# Patient Record
Sex: Male | Born: 1995 | Race: Black or African American | Hispanic: No | Marital: Single | Smoking: Current every day smoker
Health system: Southern US, Community
[De-identification: ages and names within clinical notes are randomized; demographics above are authoritative.]

## PROBLEM LIST (undated history)

## (undated) HISTORY — PX: TYMPANOSTOMY TUBE PLACEMENT: SHX32

## (undated) HISTORY — PX: KNEE SURGERY: SHX244

## (undated) HISTORY — PX: CIRCUMCISION: SUR203

---
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
## 2017-01-04 ENCOUNTER — Ambulatory Visit (INDEPENDENT_AMBULATORY_CARE_PROVIDER_SITE_OTHER): Payer: Managed Care, Other (non HMO)

## 2017-01-04 ENCOUNTER — Encounter (HOSPITAL_COMMUNITY): Payer: Self-pay | Admitting: *Deleted

## 2017-01-04 ENCOUNTER — Ambulatory Visit (HOSPITAL_COMMUNITY)
Admission: EM | Admit: 2017-01-04 | Discharge: 2017-01-04 | Disposition: A | Discharge status: Home / Self Care | Payer: Managed Care, Other (non HMO) | Attending: Emergency Medicine | Admitting: Emergency Medicine

## 2017-01-04 DIAGNOSIS — R079 Chest pain, unspecified: Secondary | ICD-10-CM

## 2017-01-04 NOTE — ED Triage Notes (Addendum)
Per pt she's been having chest pain since Tuesday, per pt he is still having chest pains today, per pt its a sharp pain in the center of his chest, pt denies any other sx. Per pt if he breaths heavily chest starts to hurt,

## 2017-01-04 NOTE — ED Notes (Signed)
EKG given to [employer] Burky PA

## 2017-01-04 NOTE — ED Provider Notes (Signed)
[facility]    CSN: [id number] Arrival date & time: 01/04/17  1019     History   Chief Complaint Chief Complaint  Patient presents with  . Chest Pain    HPI Jerry Ibarra is a 21 y.o. male.   Jerry Ibarra presents with complaints of intermittent chest pain which started 12/11. He states it is worse with different movements and with deep breathing. It is to central chest. It is sharp in nature. During childhood has seen cardiology, does not recall what this was for. Has had intermittent chest pains in the past but this has lasted longer. Currently denies any chest pain. Denies palpitations, shortness of breath, nausea, fevers, dizziness, diaphoresis. Otherwise healthy, does not take any medications. Had a cough a few days prior to pain starting but this has resolved. Without skin rash.    ROS per HPI.       History reviewed. No pertinent past medical history.  There are no active problems to display for this patient.   Past Surgical History:  Procedure Laterality Date  . CIRCUMCISION    . KNEE SURGERY    . TYMPANOSTOMY TUBE PLACEMENT         Home Medications    Prior to Admission medications   Medication Sig Start Date End Date Taking? Authorizing Provider  amoxicillin (AMOXIL) 875 MG tablet Take 1 tablet (875 mg total) by mouth 2 (two) times daily. 01/05/16   Elvina SidleLauenstein, Kurt, MD  fluticasone (FLONASE) 50 MCG/ACT nasal spray Place 2 sprays into both nostrils daily. 11/05/16   Servando Salinaossi, Catherine H, NP  guaiFENesin (MUCINEX) 600 MG 12 hr tablet Take by mouth 2 (two) times daily.    [provider]  naproxen (NAPROSYN) 375 MG tablet Take 1 tablet (375 mg total) by mouth 2 (two) times daily. 10/31/14   Deatra Canterxford, William J, FNP    Family History Family History  Problem Relation Age of Onset  . Diabetes Mother     Social History Social History   Tobacco Use  . Smoking status: Former Smoker    Types: Cigarettes  . Smokeless tobacco: Never Used    . Tobacco comment: per pt stopped Aug 2018  Substance Use Topics  . Alcohol use: No  . Drug use: No     Allergies   Patient has no known allergies.   Review of Systems Review of Systems   Physical Exam Triage Vital Signs ED Triage Vitals [01/04/17 1127]  Enc Vitals Group     BP (!) 117/59     Pulse Rate (!) 53     Resp      Temp 98.2 F (36.8 C)     Temp src      SpO2 100 %     Weight      Height      Head Circumference      Peak Flow      Pain Score      Pain Loc      Pain Edu?      Excl. in GC?    No data found.  Updated Vital Signs BP (!) 117/59 (BP Location: Left Arm)   Pulse (!) 53   Temp 98.2 F (36.8 C)   SpO2 100%   Visual Acuity Right Eye Distance:   Left Eye Distance:   Bilateral Distance:    Right Eye Near:   Left Eye Near:    Bilateral Near:     Physical Exam  Constitutional: He is oriented to  person, place, and time. He appears well-developed and well-nourished.  Non-toxic appearance. No distress.  Cardiovascular: Normal rate, regular rhythm, normal heart sounds and normal pulses.  Unable to reproduce chest pain during exam with palpation, movement or coughing. Currently denies chest pain.   Pulmonary/Chest: Effort normal and breath sounds normal.  Neurological: He is alert and oriented to person, place, and time.  Skin: Skin is warm and dry.  Vitals reviewed.  Noted diffuse St elevation to ekg which appears new from EKG from 2014.   UC Treatments / Results  Labs (all labs ordered are listed, but only abnormal results are displayed) Labs Reviewed - No data to display  EKG  EKG Interpretation None       Radiology No results found.  Procedures Procedures (including critical care time)  Medications Ordered in UC Medications - No data to display   Initial Impression / Assessment and Plan / UC Course  I have reviewed the triage vital signs and the nursing notes.  Pertinent labs & imaging results that were available  during my care of the patient were reviewed by me and considered in my medical decision making (see chart for details).     EKG and chest x-ray obtained. Patient currently without pain and unable to reproduce pain. With cardiac history and ekg findings recommended further evaluation in the ER. Dr. SwazilandJordan with cardiology reviewed EKG and agrees that consistent with early repolarization rather than STEMI. Patient stable for self transport to ER at this time for further chest pain evaluation. Patient verbalized understanding and agreeable to plan.    Final Clinical Impressions(s) / UC Diagnoses   Final diagnoses:  Chest pain, unspecified type    ED Discharge Orders    None       Controlled Substance Prescriptions [place] Controlled Substance Registry consulted? Not Applicable   Georgetta HaberBurky, Natalie B, NP 01/04/17 1225

## 2017-05-14 ENCOUNTER — Other Ambulatory Visit: Payer: Self-pay

## 2017-05-14 ENCOUNTER — Ambulatory Visit
Admission: EM | Admit: 2017-05-14 | Discharge: 2017-05-14 | Disposition: A | Discharge status: Home / Self Care | Payer: Managed Care, Other (non HMO) | Attending: Emergency Medicine | Admitting: Emergency Medicine

## 2017-05-14 ENCOUNTER — Encounter: Payer: Self-pay | Admitting: Emergency Medicine

## 2017-05-14 DIAGNOSIS — R197 Diarrhea, unspecified: Secondary | ICD-10-CM

## 2017-05-14 DIAGNOSIS — R519 Headache, unspecified: Secondary | ICD-10-CM

## 2017-05-14 DIAGNOSIS — R51 Headache: Secondary | ICD-10-CM | POA: Diagnosis not present

## 2017-05-14 DIAGNOSIS — R1110 Vomiting, unspecified: Secondary | ICD-10-CM

## 2017-05-14 MED ORDER — ACETAMINOPHEN 500 MG PO TABS
500.0000 mg | ORAL_TABLET | Freq: Once | ORAL | Status: AC
  Administered 2017-05-14: 500 mg via ORAL

## 2017-05-14 MED ORDER — KETOROLAC TROMETHAMINE 60 MG/2ML IM SOLN
30.0000 mg | Freq: Once | INTRAMUSCULAR | Status: AC
  Administered 2017-05-14: 30 mg via INTRAMUSCULAR

## 2017-05-14 MED ORDER — ONDANSETRON 4 MG PO TBDP: 4.0000 mg | ORAL_TABLET | Freq: Three times a day (TID) | ORAL | 0 refills | Status: DC | PRN

## 2017-05-14 MED ORDER — IBUPROFEN 600 MG PO TABS: 600.0000 mg | ORAL_TABLET | Freq: Four times a day (QID) | ORAL | 0 refills | Status: DC | PRN

## 2017-05-14 MED ORDER — ONDANSETRON 8 MG PO TBDP
8.0000 mg | ORAL_TABLET | Freq: Once | ORAL | Status: AC
Start: 2017-05-14 — End: 2017-05-14
  Administered 2017-05-14: 8 mg via ORAL

## 2017-05-14 NOTE — ED Notes (Signed)
Pt given water to drink. 

## 2017-05-14 NOTE — ED Provider Notes (Signed)
HPI  SUBJECTIVE:  Jerry Ibarra is a 22 y.o. male who reports a left-sided frontal constant throbbing headache starting at 3 this morning.  He also reports 4-5 episodes of watery, nonbloody diarrhea as well.  He denies abdominal pain.  He denies fevers, purulent nasal congestion, sinus pain or pressure, upper dental pain.  No neck stiffness.  Mild photophobia.  No phonophobia.  Reports one episode of nausea with emesis while at work today.  No dysarthria, aphasia, discoordination, facial droop, arm or leg weakness, visual changes.  States that he has had headaches like this before.  This is not the first or worst headache of his life.  He tried Advil 200 mg without improvement in his symptoms.  No aggravating factors.  States that his sister had similar symptoms with diarrhea yesterday.  He denies raw or undercooked foods, questionable leftovers, travel.  No alcohol use last night.  Past medical history of headaches, no history of migraines, strokes, aneurysms, abdominal surgeries, diabetes, hypertension, kidney disease.  PMD: None.  History reviewed. No pertinent past medical history.  Past Surgical History:  Procedure Laterality Date  . CIRCUMCISION    . KNEE SURGERY    . TYMPANOSTOMY TUBE PLACEMENT      Family History  Problem Relation Age of Onset  . Diabetes Mother     Social History   Tobacco Use  . Smoking status: Former Smoker    Types: Cigarettes    Last attempt to quit: 09/2016    Years since quitting: 0.6  . Smokeless tobacco: Never Used  . Tobacco comment: per pt stopped Aug 2018  Substance Use Topics  . Alcohol use: No  . Drug use: No    No current facility-administered medications for this encounter.   Current Outpatient Medications:  .  ibuprofen (ADVIL,MOTRIN) 600 MG tablet, Take 1 tablet (600 mg total) by mouth every 6 (six) hours as needed., Disp: 30 tablet, Rfl: 0 .  ondansetron (ZOFRAN ODT) 4 MG disintegrating tablet, Take 1 tablet (4 mg total) by mouth  every 8 (eight) hours as needed for nausea or vomiting., Disp: 20 tablet, Rfl: 0  No Known Allergies   ROS  As noted in HPI.   Physical Exam  BP 115/72 (BP Location: Left Arm)   Pulse 71   Temp 97.7 F (36.5 C) (Oral)   Resp 16   Ht 5\' 10"  (1.778 m)   Wt 175 lb (79.4 kg)   SpO2 100%   BMI 25.11 kg/m   Constitutional: Well developed, well nourished, no acute distress Eyes: PERRL, EOMI, conjunctiva normal bilaterally.  Positive mild photophobia HENT: Normocephalic, atraumatic,mucus membranes moist, normal dentition.  TM normal b/l. No TMJ tenderness. No nasal congestion, no sinus tenderness. No temporal artery tenderness.  Neck: no cervical LN,  No meningismus Respiratory: normal inspiratory effort, lungs clear bilaterally Cardiovascular: Normal rate, no murmurs, rubs, gallops GI: normal Appearance, soft, mild tenderness in the epigastric region with deep palpation, active bowel sounds, no guarding, rebound.  Negative Murphy, negative McBurney. skin: No rash, skin intact Musculoskeletal: No edema, no tenderness, no deformities Neurologic: Alert & oriented x 3, CN II-XII intact, romberg neg, finger-> nose, heel-> shin equal b/l, Romberg neg, tandem gait steady Psychiatric: Speech and behavior appropriate   ED Course   Medications  acetaminophen (TYLENOL) tablet 500 mg (500 mg Oral Given 05/14/17 0846)  ketorolac (TORADOL) injection 30 mg (30 mg Intramuscular Given 05/14/17 0847)  ondansetron (ZOFRAN-ODT) disintegrating tablet 8 mg (8 mg Oral Given 05/14/17 0846)  Orders Placed This Encounter  Procedures  . Offer Fluids    Standing Status:   Standing    Number of Occurrences:   20   No results found for this or any previous visit (from the past 24 hour(s)). No results found.   ED Clinical Impression  Vomiting and diarrhea  Acute nonintractable headache, unspecified headache type  ED Assessment/Plan  Pt describing typical pain, no sudden onset. Doubt SAH, ICH  or space occupying lesion. Pt without fevers/chills, Pt has no meningeal sx, no nuchal rigidity. Doubt meningitis. Pt with normal neuro exam, no evidence of CVA/TIA.  Pt BP not elevated significantly, doubt hypertensive emergency. No evidence of temporal artery tenderness, no evidence of glaucoma or other ocular pathology.  Abdomen is also benign.  He does not appear to be significantly dehydrated.  Suspect viral syndrome since his sister had similar symptoms yesterday.  we will give Toradol, Zofran, APAP 1 gm. offer fluids and reassess.  Work note for today and tomorrow.  Pt much improved after medications. Pt with continued non-focal neuro exam. Will d/c home with nsaid, antiemetic, and have pt F/U with PCP of choice.  Will provide primary care referral list. Discussed MDM, plan for follow up, signs and sx that should prompt return to ER. Pt agrees with plan  Meds ordered this encounter  Medications  . acetaminophen (TYLENOL) tablet 500 mg  . ketorolac (TORADOL) injection 30 mg  . ondansetron (ZOFRAN-ODT) disintegrating tablet 8 mg  . ondansetron (ZOFRAN ODT) 4 MG disintegrating tablet    Sig: Take 1 tablet (4 mg total) by mouth every 8 (eight) hours as needed for nausea or vomiting.    Dispense:  20 tablet    Refill:  0  . ibuprofen (ADVIL,MOTRIN) 600 MG tablet    Sig: Take 1 tablet (600 mg total) by mouth every 6 (six) hours as needed.    Dispense:  30 tablet    Refill:  0    *This clinic note was created using Scientist, clinical (histocompatibility and immunogenetics)Dragon dictation software. Therefore, there may be occasional mistakes despite careful proofreading.  ?   Domenick GongMortenson, Viraj Liby, MD 05/14/17 1057

## 2017-05-14 NOTE — Discharge Instructions (Addendum)
600 mg of ibuprofen with 1 g of Tylenol 3-4 times a day.  Take the Zofran as needed for nausea, and diarrhea.  It will make you constipated.  Push electrolyte containing fluids such as Pedialyte.  Follow-up with a primary care physician as needed for routine care. see list below.  Here is a list of primary care providers who are taking new patients:  Dr. Elizabeth Sauereanna Jones, Dr. Schuyler AmorWilliam Plonk [address] [place] [address] [phone]  [facility] [place] [address]  [place] [address]  [phone]  [facility] [address]  [place], [place] [address] [phone]  [facility] [place] [address]  [phone] [place], [place] [address]  Here are clinics/ other resources who will see you if you do not have insurance. Some have certain criteria that you must meet. Call them and find out what they are:  [facility]: [address] [place] [place], [place] [address] Phone: [phone] Hours: First and Third Saturdays of each Month, 9 a.m. - 1 p.m.  [facility]: [address], [place], [place] [address] Phone: [phone] Hours: Tuesday, 4 p.m. - 8 p.m. Thursday, 1 p.m. - 8 p.m. Wednesday, 9 a.m. - [facility]  [facility] [address], [place], [place] [address] Phone: [phone] Pharmacy Phone Number: [phone] Dental Phone Number: [phone] [facility] Insurance Help: [phone]  Dental Hours: Monday - Thursday, 8 a.m. - 6 p.m.  [facility] [address] [place], [place] [address] Phone: [phone] Pharmacy Phone Number: [phone] [facility] Insurance Help: [phone]  [facility] [address] [place], [place] [address] Phone: [phone] Pharmacy Phone Number: [phone] [facility] Insurance Help: [phone]  [facility] [address] [place], [place] [address] Phone: [phone] [facility] Insurance Help: [phone]    [facility] [address] [place], [place] [address] Phone: [phone]  Go to www.goodrx.com to look up your medications. This will give you a list of where you can find your prescriptions at the most affordable prices. Or ask the pharmacist what the cash price is, or if they have any other discount programs available to help make your medication more affordable. This can be less expensive than what you would pay with insurance.

## 2017-05-14 NOTE — ED Triage Notes (Signed)
Patient in today c/o diarrhea since 3am this morning. Patient states he is mainly here because when he is at work he gets bad headaches. Patient states his head is throbbing now.

## 2017-05-19 ENCOUNTER — Encounter (HOSPITAL_COMMUNITY): Payer: Self-pay | Admitting: Emergency Medicine

## 2017-05-19 ENCOUNTER — Ambulatory Visit (HOSPITAL_COMMUNITY)
Admission: EM | Admit: 2017-05-19 | Discharge: 2017-05-19 | Disposition: A | Discharge status: Home / Self Care | Payer: Managed Care, Other (non HMO) | Attending: Family Medicine | Admitting: Family Medicine

## 2017-05-19 DIAGNOSIS — Z202 Contact with and (suspected) exposure to infections with a predominantly sexual mode of transmission: Secondary | ICD-10-CM | POA: Insufficient documentation

## 2017-05-19 DIAGNOSIS — Z113 Encounter for screening for infections with a predominantly sexual mode of transmission: Secondary | ICD-10-CM | POA: Diagnosis not present

## 2017-05-19 DIAGNOSIS — Z87891 Personal history of nicotine dependence: Secondary | ICD-10-CM | POA: Diagnosis not present

## 2017-05-19 NOTE — Discharge Instructions (Signed)
We have sent testing for sexually transmitted infections. We will notify you of any positive results once they are received. If required, we will prescribe any medications you might need. °

## 2017-05-19 NOTE — ED Triage Notes (Signed)
Pt states "I just want to be checked for STIS" no symptoms.

## 2017-05-19 NOTE — ED Provider Notes (Signed)
-   [facility]   [id number] 05/19/17 Arrival Time: 1208  ASSESSMENT & PLAN:  1. Screening for STDs (sexually transmitted diseases)    Pending: Labs Reviewed  URINE CYTOLOGY ANCILLARY ONLY   Will notify of any positive results. Instructed to refrain from sexual activity until results have been received.  Reviewed expectations re: course of current medical issues. Questions answered. Outlined signs and symptoms indicating need for more acute intervention. Patient verbalized understanding. After Visit Summary given.   SUBJECTIVE:  Jerry Ibarra is a 21 y.o. male who requests STD screening. No symptoms. Afebrile. No abdominal or pelvic pain. No n/v. No rashes or lesions. Sexually active with single male partner.  ROS: As per HPI.  OBJECTIVE:  Vitals:   05/19/17 1247  BP: 129/62  Pulse: (!) 55  Resp: 18  Temp: 98.3 F (36.8 C)  SpO2: 100%     General appearance: alert, cooperative, appears stated age and no distress Throat: lips, mucosa, and tongue normal; teeth and gums normal Back: no CVA tenderness Abdomen: soft, non-tender GU: declines Skin: warm and dry Psychological: Alert and cooperative. Normal mood and affect.   Labs Reviewed  URINE CYTOLOGY ANCILLARY ONLY    No Known Allergies   Family History  Problem Relation Age of Onset  . Diabetes Mother    Social History   Socioeconomic History  . Marital status: Single    Spouse name: Not on file  . Number of children: Not on file  . Years of education: Not on file  . Highest education level: Not on file  Occupational History  . Not on file  Social Needs  . Financial resource strain: Not on file  . Food insecurity:    Worry: Not on file    Inability: Not on file  . Transportation needs:    Medical: Not on file    Non-medical: Not on file  Tobacco Use  . Smoking status: Former Smoker    Types: Cigarettes    Last attempt to quit: 09/2016    Years since quitting: 0.6  . Smokeless  tobacco: Never Used  . Tobacco comment: per pt stopped Aug 2018  Substance and Sexual Activity  . Alcohol use: No  . Drug use: No  . Sexual activity: Not on file  Lifestyle  . Physical activity:    Days per week: Not on file    Minutes per session: Not on file  . Stress: Not on file  Relationships  . Social connections:    Talks on phone: Not on file    Gets together: Not on file    Attends religious service: Not on file    Active member of club or organization: Not on file    Attends meetings of clubs or organizations: Not on file    Relationship status: Not on file  . Intimate partner violence:    Fear of current or ex partner: Not on file    Emotionally abused: Not on file    Physically abused: Not on file    Forced sexual activity: Not on file  Other Topics Concern  . Not on file  Social History Narrative  . Not on file          Mardella Layman, MD 05/19/17 1315

## 2017-05-21 LAB — URINE CYTOLOGY ANCILLARY ONLY
CHLAMYDIA, DNA PROBE: POSITIVE — AB
Neisseria Gonorrhea: NEGATIVE
TRICH (WINDOWPATH): NEGATIVE

## 2017-05-22 ENCOUNTER — Telehealth (HOSPITAL_COMMUNITY): Payer: Self-pay

## 2017-05-22 MED ORDER — AZITHROMYCIN 250 MG PO TABS: 1000.0000 mg | ORAL_TABLET | Freq: Every day | ORAL | 0 refills | Status: AC

## 2017-05-22 NOTE — Progress Notes (Signed)
Chlamydia is positive.  Rx po zithromax 1g #1 dose no refills was sent to the pharmacy of record.  Attempted to reach patient regarding results, no answer at this time, voicemail left.  Need to educate to please refrain from sexual intercourse for 7 days to give the medicine time to work, sexual partners need to be notified and tested/treated.  Condoms may reduce risk of reinfection.  Recheck or followup with PCP for further evaluation if symptoms are not improving.   [facility] notified

## 2017-05-24 ENCOUNTER — Telehealth (HOSPITAL_COMMUNITY): Payer: Self-pay

## 2017-05-24 MED ORDER — AZITHROMYCIN 250 MG PO TABS: 1000.0000 mg | ORAL_TABLET | Freq: Once | ORAL | 0 refills | Status: AC

## 2017-05-24 MED ORDER — ONDANSETRON HCL 4 MG PO TABS: 4.0000 mg | ORAL_TABLET | ORAL | 0 refills | Status: AC

## 2017-05-24 NOTE — Telephone Encounter (Signed)
Pt called reporting throwing up azithromycin. Per [facility] patient must take zofran and wait 30 minutes then repeat dose.

## 2017-05-24 NOTE — Telephone Encounter (Signed)
Pt returned to office to go over results. Aware of positive Chlamydia test and need for treatment, rx sent to pharmacy of choice.  Educated on safe sex practices. Pt verbalized understanding.

## 2017-06-21 ENCOUNTER — Encounter (HOSPITAL_COMMUNITY): Payer: Self-pay | Admitting: Family Medicine

## 2017-06-21 ENCOUNTER — Ambulatory Visit (HOSPITAL_COMMUNITY)
Admission: EM | Admit: 2017-06-21 | Discharge: 2017-06-21 | Disposition: A | Discharge status: Home / Self Care | Payer: Managed Care, Other (non HMO) | Attending: Family Medicine | Admitting: Family Medicine

## 2017-06-21 DIAGNOSIS — J029 Acute pharyngitis, unspecified: Secondary | ICD-10-CM | POA: Insufficient documentation

## 2017-06-21 DIAGNOSIS — Z87891 Personal history of nicotine dependence: Secondary | ICD-10-CM | POA: Insufficient documentation

## 2017-06-21 LAB — POCT RAPID STREP A: STREPTOCOCCUS, GROUP A SCREEN (DIRECT): NEGATIVE

## 2017-06-21 MED ORDER — CETIRIZINE-PSEUDOEPHEDRINE ER 5-120 MG PO TB12: 1.0000 | ORAL_TABLET | Freq: Every day | ORAL | 0 refills | Status: DC

## 2017-06-21 NOTE — ED Triage Notes (Signed)
Pt here for sore throat and fever

## 2017-06-21 NOTE — ED Provider Notes (Signed)
[facility]   [id number] 06/21/17 Arrival Time: 7846  SUBJECTIVE: History from: patient.  Jerry Ibarra is a 22 y.o. male who presents with abrupt onset of sore throat that began 1 day ago.  Admits to positive sick exposure.  Has tried tylenol with temporary relief.  Symptoms are made worse with swallowing.  Reports previous symptoms in the past.   Complains of chills.  Denies fever, fatigue, sinus pain, rhinorrhea, cough, SOB, wheezing, chest pain, nausea, abdominal pain, changes in bowel or bladder habits.    ROS: As per HPI.  History reviewed. No pertinent past medical history. Past Surgical History:  Procedure Laterality Date  . CIRCUMCISION    . KNEE SURGERY    . TYMPANOSTOMY TUBE PLACEMENT     No Known Allergies No current facility-administered medications on file prior to encounter.    Current Outpatient Medications on File Prior to Encounter  Medication Sig Dispense Refill  . ibuprofen (ADVIL,MOTRIN) 600 MG tablet Take 1 tablet (600 mg total) by mouth every 6 (six) hours as needed. (Patient not taking: Reported on 05/19/2017) 30 tablet 0   Social History   Socioeconomic History  . Marital status: Single    Spouse name: Not on file  . Number of children: Not on file  . Years of education: Not on file  . Highest education level: Not on file  Occupational History  . Not on file  Social Needs  . Financial resource strain: Not on file  . Food insecurity:    Worry: Not on file    Inability: Not on file  . Transportation needs:    Medical: Not on file    Non-medical: Not on file  Tobacco Use  . Smoking status: Former Smoker    Types: Cigarettes    Last attempt to quit: 09/2016    Years since quitting: 0.7  . Smokeless tobacco: Never Used  . Tobacco comment: per pt stopped Aug 2018  Substance and Sexual Activity  . Alcohol use: No  . Drug use: No  . Sexual activity: Not on file  Lifestyle  . Physical activity:    Days per week: Not on file   Minutes per session: Not on file  . Stress: Not on file  Relationships  . Social connections:    Talks on phone: Not on file    Gets together: Not on file    Attends religious service: Not on file    Active member of club or organization: Not on file    Attends meetings of clubs or organizations: Not on file    Relationship status: Not on file  . Intimate partner violence:    Fear of current or ex partner: Not on file    Emotionally abused: Not on file    Physically abused: Not on file    Forced sexual activity: Not on file  Other Topics Concern  . Not on file  Social History Narrative  . Not on file   Family History  Problem Relation Age of Onset  . Diabetes Mother     OBJECTIVE:  Vitals:   06/21/17 1842  BP: 122/73  Pulse: 79  Resp: 18  Temp: 99.7 F (37.6 C)  SpO2: 100%     General appearance: AOx3; in no acute distress; appears stated age HEENT: Ears: EACs clear, TMs pearly gray with visible cone of light, without erythema; Eyes: PERRL, EOMI grossly; Sinuses nontender to palpation; Nose: clear rhinorrhea; Throat: oropharynx mildly erythematous, tonsils 1+ without white tonsillar  exudates, uvula midline Neck: supple without LAD Lungs: CTA bilaterally without adventitious breath sounds Heart: regular rate and rhythm.  Radial pulses 2+ symmetrical bilaterally Skin: warm and dry Psychological: alert and cooperative; normal mood and affect   ASSESSMENT & PLAN:  1. Viral pharyngitis     Meds ordered this encounter  Medications  . cetirizine-pseudoephedrine (ZYRTEC-D) 5-120 MG tablet    Sig: Take 1 tablet by mouth daily.    Dispense:  30 tablet    Refill:  0    Order Specific Question:   Supervising Provider    Answer:   Isa Rankin [[id number]]    Strep test negative, will send out for culture and we will call you with results Declines test for mono at this time Get plenty of rest and push fluids Zyrtec D prescribed.   Perform salt water gargles at  home, use throat lozenges, drink warm or cool liquids as needed for symptomatic relief Follow up with PCP if symptoms persist Return or go to ER if you have any new or worsening symptoms   Reviewed expectations re: course of current medical issues. Questions answered. Outlined signs and symptoms indicating need for more acute intervention. Patient verbalized understanding. After Visit Summary given.        Rennis Harding, PA-C 06/21/17 0454

## 2017-06-21 NOTE — Discharge Instructions (Addendum)
Strep test negative, will send out for culture and we will call you with results Declines test for mono at this time Get plenty of rest and push fluids Zyrtec D prescribed.   Perform salt water gargles at home, use throat lozenges, drink warm or cool liquids as needed for symptomatic relief Follow up with PCP if symptoms persist Return or go to ER if you have any new or worsening symptoms

## 2017-06-24 LAB — CULTURE, GROUP A STREP (THRC)

## 2017-07-04 ENCOUNTER — Ambulatory Visit (HOSPITAL_COMMUNITY)
Admission: EM | Admit: 2017-07-04 | Discharge: 2017-07-04 | Disposition: A | Discharge status: Home / Self Care | Payer: Managed Care, Other (non HMO) | Attending: Family Medicine | Admitting: Family Medicine

## 2017-07-04 ENCOUNTER — Encounter (HOSPITAL_COMMUNITY): Payer: Self-pay | Admitting: Emergency Medicine

## 2017-07-04 ENCOUNTER — Other Ambulatory Visit: Payer: Self-pay

## 2017-07-04 DIAGNOSIS — L853 Xerosis cutis: Secondary | ICD-10-CM

## 2017-07-04 NOTE — ED Triage Notes (Signed)
States dry, itchy, red skin in genital area.  First noticed this problem one month ago.  At the time had used a different soap, has changed soaps again and no better

## 2017-07-04 NOTE — ED Provider Notes (Signed)
[facility]   [id number] 07/04/17 Arrival Time: 1001  ASSESSMENT & PLAN:  1. Dry skin dermatitis    OTC Eucerin. Warm showers only. No condom use. Return if not improving over the next 1-2 weeks.  Reviewed expectations re: course of current medical issues. Questions answered. Outlined signs and symptoms indicating need for more acute intervention. Patient verbalized understanding. After Visit Summary given.   SUBJECTIVE:  Jerry Ibarra is a 22 y.o. male who presents with a skin complaint.   Location: penis Onset: gradual Duration: 1 month Pruritic? Yes Painful? No Progression: stable  Drainage? Yes  Known trigger? No  New soaps/lotions/topicals/detergents? No Environmental exposures or allergies? none Contacts with similar? No Recent travel? No  Other associated symptoms: none Therapies tried thus far: none Denies fever. No penile discharge or lesions. No specific aggravating or alleviating factors reported.  Last used condom 3-4 weeks ago but thinks dry skin started before then.  ROS: As per HPI.  OBJECTIVE: Vitals:   07/04/17 1016  BP: 118/67  Pulse: 60  Resp: 16  Temp: 98.1 F (36.7 C)  TempSrc: Oral  SpO2: 100%    General appearance: alert; no distress Lungs: clear to auscultation bilaterally Heart: regular rate and rhythm Extremities: no edema Skin: warm and dry; over penile shaft, dry skin; no penile lesions Psychological: alert and cooperative; normal mood and affect  No Known Allergies   Social History   Socioeconomic History  . Marital status: Single    Spouse name: Not on file  . Number of children: Not on file  . Years of education: Not on file  . Highest education level: Not on file  Occupational History  . Not on file  Social Needs  . Financial resource strain: Not on file  . Food insecurity:    Worry: Not on file    Inability: Not on file  . Transportation needs:    Medical: Not on file    Non-medical: Not on  file  Tobacco Use  . Smoking status: Former Smoker    Types: Cigarettes    Last attempt to quit: 09/2016    Years since quitting: 0.7  . Smokeless tobacco: Never Used  . Tobacco comment: per pt stopped Aug 2018  Substance and Sexual Activity  . Alcohol use: No  . Drug use: No  . Sexual activity: Not on file  Lifestyle  . Physical activity:    Days per week: Not on file    Minutes per session: Not on file  . Stress: Not on file  Relationships  . Social connections:    Talks on phone: Not on file    Gets together: Not on file    Attends religious service: Not on file    Active member of club or organization: Not on file    Attends meetings of clubs or organizations: Not on file    Relationship status: Not on file  . Intimate partner violence:    Fear of current or ex partner: Not on file    Emotionally abused: Not on file    Physically abused: Not on file    Forced sexual activity: Not on file  Other Topics Concern  . Not on file  Social History Narrative  . Not on file   Family History  Problem Relation Age of Onset  . Diabetes Mother    Past Surgical History:  Procedure Laterality Date  . CIRCUMCISION    . KNEE SURGERY    . TYMPANOSTOMY TUBE PLACEMENT  Mardella Layman, MD 07/04/17 1034

## 2017-07-04 NOTE — Discharge Instructions (Addendum)
Try over the counter Eucerin cream twice daily for the next 1-2 weeks. Warm showers only. No condom use. Return if not improving.

## 2018-02-08 ENCOUNTER — Encounter (HOSPITAL_COMMUNITY): Payer: Self-pay | Admitting: Emergency Medicine

## 2018-02-08 ENCOUNTER — Ambulatory Visit (HOSPITAL_COMMUNITY)
Admission: EM | Admit: 2018-02-08 | Discharge: 2018-02-08 | Disposition: A | Discharge status: Home / Self Care | Payer: Self-pay | Attending: Internal Medicine | Admitting: Internal Medicine

## 2018-02-08 ENCOUNTER — Other Ambulatory Visit: Payer: Self-pay

## 2018-02-08 DIAGNOSIS — J111 Influenza due to unidentified influenza virus with other respiratory manifestations: Secondary | ICD-10-CM

## 2018-02-08 DIAGNOSIS — R69 Illness, unspecified: Secondary | ICD-10-CM | POA: Insufficient documentation

## 2018-02-08 MED ORDER — OSELTAMIVIR PHOSPHATE 75 MG PO CAPS: 75.0000 mg | ORAL_CAPSULE | Freq: Two times a day (BID) | ORAL | 0 refills | Status: DC

## 2018-02-08 NOTE — ED Triage Notes (Signed)
PT reports chills, cough, and congestion for 2 days.

## 2018-02-08 NOTE — Discharge Instructions (Signed)
I suspect you are having early influenza, so I am placing your an Tamiflu.  Drink lots of fluids, sip of chicken broth and do not go to work until you do not have a fever for 24h Continue taking the dayquil

## 2018-02-08 NOTE — ED Provider Notes (Signed)
[facility]    CSN: [id number] Arrival date & time: 02/08/18  1419     History   Chief Complaint Chief Complaint  Patient presents with  . URI    HPI Jerry Ibarra is a 23 y.o. male.   Onset of cough 1/15 and cough and some chills. Last night while at work his nose was dripping ever more. Cough is productive with clear mucous. Yesterday at work he was feeling hot and cold but did not check his temp. Denies ST, ear pain, body aches or HA. Has been taking Dayquil. 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     History reviewed. No pertinent past medical history.  There are no active problems to display for this patient.   Past Surgical History:  Procedure Laterality Date  . CIRCUMCISION    . KNEE SURGERY    . TYMPANOSTOMY TUBE PLACEMENT       Home Medications    Prior to Admission medications   Medication Sig Start Date End Date Taking? Authorizing Provider  cetirizine-pseudoephedrine (ZYRTEC-D) 5-120 MG tablet Take 1 tablet by mouth daily. 06/21/17   Wurst, [place]ittany, PA-C  ibuprofen (ADVIL,MOTRIN) 600 MG tablet Take 1 tablet (600 mg total) by mouth every 6 (six) hours as needed. Patient not taking: Reported on 05/19/2017 05/14/17   Domenick GongMortenson, Ashley, MD    Family History Family History  Problem Relation Age of Onset  . Diabetes Mother     Social History Social History   Tobacco Use  . Smoking status: Former Smoker    Types: Cigarettes    Last attempt to quit: 09/2016    Years since quitting: 1.3  . Smokeless tobacco: Never Used  . Tobacco comment: per pt stopped Aug 2018  Substance Use Topics  . Alcohol use: No  . Drug use: No     Allergies   Patient has no known allergies.   Review of Systems Review of Systems  Constitutional: Positive for chills. Negative for diaphoresis and fever.       Has felt hot and cold  HENT: Positive for congestion and rhinorrhea. Negative for ear discharge, ear pain, sore throat and  trouble swallowing.   Eyes: Negative for discharge.  Respiratory: Positive for cough. Negative for chest tightness, shortness of breath and wheezing.   Cardiovascular: Negative for chest pain.  Gastrointestinal: Negative for diarrhea, nausea and vomiting.  Musculoskeletal: Negative for myalgias.  Skin: Negative for rash.  Neurological: Negative for headaches.  Hematological: Negative for adenopathy.     Physical Exam Triage Vital Signs ED Triage Vitals  Enc Vitals Group     BP 02/08/18 1454 129/74     Pulse Rate 02/08/18 1454 67     Resp 02/08/18 1454 16     Temp 02/08/18 1454 98 F (36.7 C)     Temp Source 02/08/18 1454 Oral     SpO2 02/08/18 1454 100 %     Weight --      Height --      Head Circumference --      Peak Flow --      Pain Score 02/08/18 1453 6     Pain Loc --      Pain Edu? --      Excl. in GC? --    No data found.  Updated Vital Signs BP 129/74   Pulse 67   Temp 98 F (36.7 C) (Oral)   Resp 16   SpO2 100%  Visual Acuity Right Eye Distance:   Left Eye Distance:   Bilateral Distance:    Right Eye Near:   Left Eye Near:    Bilateral Near:     Physical Exam Vitals signs and nursing note reviewed.  Constitutional:      General: He is not in acute distress.    Appearance: He is ill-appearing. He is not toxic-appearing or diaphoretic.  HENT:     Head: Normocephalic.     Right Ear: Tympanic membrane, ear canal and external ear normal.     Left Ear: Tympanic membrane, ear canal and external ear normal.     Nose: Congestion and rhinorrhea present.     Mouth/Throat:     Mouth: Mucous membranes are moist.     Pharynx: Oropharynx is clear.  Eyes:     General: No scleral icterus.       Right eye: No discharge.        Left eye: No discharge.     Extraocular Movements: Extraocular movements intact.     Conjunctiva/sclera: Conjunctivae normal.  Neck:     Musculoskeletal: Neck supple. No neck rigidity or muscular tenderness.  Cardiovascular:      Rate and Rhythm: Normal rate and regular rhythm.     Heart sounds: No murmur.  Pulmonary:     Effort: Pulmonary effort is normal.     Breath sounds: Normal breath sounds.  Musculoskeletal: Normal range of motion.  Lymphadenopathy:     Cervical: No cervical adenopathy.  Skin:    General: Skin is warm and dry.     Findings: No rash.  Neurological:     Mental Status: He is alert and oriented to person, place, and time.     Gait: Gait normal.  Psychiatric:        Mood and Affect: Mood normal.        Behavior: Behavior normal.        Thought Content: Thought content normal.        Judgment: Judgment normal.    UC Treatments / Results  Labs (all labs ordered are listed, but only abnormal results are displayed) Labs Reviewed - No data to display  EKG None  Radiology No results found.  Procedures Procedures  Medications Ordered in UC Medications - No data to display  Initial Impression / Assessment and Plan / UC Course  I have reviewed the triage vital signs and the nursing notes. I explained to him we dont have flu test here, but I suspect early influenza due to exposure. I went ahead and placed him on Tamiflu as noted. May continue with Dayquil. See instructions.    Final Clinical Impressions(s) / UC Diagnoses   Final diagnoses:  None   Discharge Instructions   None    ED Prescriptions    None     Controlled Substance Prescriptions [place] Controlled Substance Registry consulted?    Garey Ham, Cordelia Poche 02/08/18 2158

## 2018-02-22 ENCOUNTER — Other Ambulatory Visit: Payer: Self-pay

## 2018-02-22 ENCOUNTER — Ambulatory Visit (HOSPITAL_COMMUNITY)
Admission: EM | Admit: 2018-02-22 | Discharge: 2018-02-22 | Disposition: A | Discharge status: Home / Self Care | Payer: Self-pay | Attending: Internal Medicine | Admitting: Internal Medicine

## 2018-02-22 ENCOUNTER — Encounter (HOSPITAL_COMMUNITY): Payer: Self-pay | Admitting: Emergency Medicine

## 2018-02-22 DIAGNOSIS — J36 Peritonsillar abscess: Secondary | ICD-10-CM | POA: Insufficient documentation

## 2018-02-22 LAB — POCT RAPID STREP A: Streptococcus, Group A Screen (Direct): NEGATIVE

## 2018-02-22 MED ORDER — LIDOCAINE HCL (PF) 1 % IJ SOLN
INTRAMUSCULAR | Status: AC
  Filled 2018-02-22: qty 2

## 2018-02-22 MED ORDER — CEFTRIAXONE SODIUM 1 G IJ SOLR
INTRAMUSCULAR | Status: AC
  Filled 2018-02-22: qty 10

## 2018-02-22 MED ORDER — DEXAMETHASONE SODIUM PHOSPHATE 10 MG/ML IJ SOLN
10.0000 mg | Freq: Once | INTRAMUSCULAR | Status: AC
  Administered 2018-02-22: 10 mg via INTRAMUSCULAR

## 2018-02-22 MED ORDER — CEFTRIAXONE SODIUM 1 G IJ SOLR
1.0000 g | Freq: Once | INTRAMUSCULAR | Status: AC
  Administered 2018-02-22: 1 g via INTRAMUSCULAR

## 2018-02-22 MED ORDER — CLINDAMYCIN HCL 150 MG PO CAPS: 300.0000 mg | ORAL_CAPSULE | Freq: Three times a day (TID) | ORAL | 0 refills | Status: AC

## 2018-02-22 MED ORDER — DEXAMETHASONE SODIUM PHOSPHATE 10 MG/ML IJ SOLN
INTRAMUSCULAR | Status: AC
  Filled 2018-02-22: qty 1

## 2018-02-22 MED ORDER — PREDNISONE 10 MG (21) PO TBPK: ORAL_TABLET | ORAL | 0 refills | Status: DC

## 2018-02-22 NOTE — ED Notes (Signed)
Patient able to ambulate independently  

## 2018-02-22 NOTE — ED Provider Notes (Signed)
[facility]    CSN: [id number] Arrival date & time: 02/22/18  1757     History   Chief Complaint Chief Complaint  Patient presents with  . Sore Throat    HPI Jerry Ibarra is a 23 y.o. male.   Patient is a 23 year old male that presents with approximately 2 to 3 days of sore throat.  Reports that it started on the right side and now has progressed more to the left with more increased swelling on the left.  He is having pain radiating into his left ear and face.  He is having pain with swallowing and spitting out his secretions.  Slightly muffled voice.  He is unsure of any fever but has had some body aches and chills.  No recent sick contacts.  He was seen here approximately 2 weeks ago and treated for flu.  No trismus.  ROS per HPI      History reviewed. No pertinent past medical history.  There are no active problems to display for this patient.   Past Surgical History:  Procedure Laterality Date  . CIRCUMCISION    . KNEE SURGERY    . TYMPANOSTOMY TUBE PLACEMENT         Home Medications    Prior to Admission medications   Medication Sig Start Date End Date Taking? Authorizing Provider  clindamycin (CLEOCIN) 150 MG capsule Take 2 capsules (300 mg total) by mouth 3 (three) times daily for 10 days. 02/22/18 03/04/18  Dahlia ByesBast, Marcques Wrightsman A, NP  oseltamivir (TAMIFLU) 75 MG capsule Take 1 capsule (75 mg total) by mouth every 12 (twelve) hours. 02/08/18   Rodriguez-Southworth, Nettie ElmSylvia, PA-C  predniSONE (STERAPRED UNI-PAK 21 TAB) 10 MG (21) TBPK tablet 6 tabs for 1 day, then 5 tabs for 1 das, then 4 tabs for 1 day, then 3 tabs for 1 day, 2 tabs for 1 day, then 1 tab for 1 day 02/22/18   Janace ArisBast, Felicia Both A, NP    Family History Family History  Problem Relation Age of Onset  . Diabetes Mother     Social History Social History   Tobacco Use  . Smoking status: Former Smoker    Types: Cigarettes    Last attempt to quit: 09/2016    Years since quitting: 1.4  .  Smokeless tobacco: Never Used  . Tobacco comment: per pt stopped Aug 2018  Substance Use Topics  . Alcohol use: No  . Drug use: No     Allergies   Patient has no known allergies.   Review of Systems Review of Systems   Physical Exam Triage Vital Signs ED Triage Vitals  Enc Vitals Group     BP 02/22/18 1835 137/75     Pulse Rate 02/22/18 1835 97     Resp 02/22/18 1835 16     Temp 02/22/18 1835 98.9 F (37.2 C)     Temp Source 02/22/18 1835 Temporal     SpO2 02/22/18 1835 100 %     Weight --      Height --      Head Circumference --      Peak Flow --      Pain Score 02/22/18 1832 7     Pain Loc --      Pain Edu? --      Excl. in GC? --    No data found.  Updated Vital Signs BP 137/75   Pulse 97   Temp 98.9 F (37.2 C) (Temporal)   Resp 16  SpO2 100%   Visual Acuity Right Eye Distance:   Left Eye Distance:   Bilateral Distance:    Right Eye Near:   Left Eye Near:    Bilateral Near:     Physical Exam Constitutional:      General: He is not in acute distress.    Appearance: He is well-developed. He is not ill-appearing, toxic-appearing or diaphoretic.  HENT:     Head: Normocephalic and atraumatic.     Right Ear: Tympanic membrane and ear canal normal.     Left Ear: Tympanic membrane and ear canal normal.     Nose: No congestion or rhinorrhea.     Mouth/Throat:     Pharynx: Uvula midline.     Tonsils: Tonsillar exudate and tonsillar abscess present. Swelling: 2+ on the right. 3+ on the left.  Cardiovascular:     Rate and Rhythm: Normal rate and regular rhythm.  Pulmonary:     Effort: Pulmonary effort is normal.     Breath sounds: Normal breath sounds.  Lymphadenopathy:     Cervical: Cervical adenopathy present.  Skin:    General: Skin is warm and dry.  Neurological:     Mental Status: He is alert.  Psychiatric:        Mood and Affect: Mood normal.      UC Treatments / Results  Labs (all labs ordered are listed, but only abnormal results  are displayed) Labs Reviewed  CULTURE, GROUP A STREP [facility])  POCT RAPID STREP A    EKG None  Radiology No results found.  Procedures Procedures (including critical care time)  Medications Ordered in UC Medications  dexamethasone (DECADRON) injection 10 mg (10 mg Intramuscular Given 02/22/18 1930)  cefTRIAXone (ROCEPHIN) injection 1 g (1 g Intramuscular Given 02/22/18 1936)    Initial Impression / Assessment and Plan / UC Course  I have reviewed the triage vital signs and the nursing notes.  Pertinent labs & imaging results that were available during my care of the patient were reviewed by me and considered in my medical decision making (see chart for details).     Pt is a 23 year old male that presents with left tonsillar abscess Rapid strep negative More swelling on the left and slightly muffled voice.  He is able to swallow secretions and is drinking water in the exam room.  There is no uvula deviation.  He is talking and there is no trismus.  I feel okay to treat outpatient with clindamycin and prednisone.  Dexamethasone and rocephin injection given here in the clinic.  Strict precautions that if the symptoms worsen despite treatment he needs to go to the ER.  Pt understanding and agreed to plan.   Final Clinical Impressions(s) / UC Diagnoses   Final diagnoses:  Tonsillar abscess     Discharge Instructions     Prednisone pack as prescribed.  Clindamycin 300 mg every 8 hours x 10 days  We gave you a steroid injection and antibiotic injection here in the clinic For more severe or worsening symptoms despite treatment please go to the ER.      ED Prescriptions    Medication Sig Dispense Auth. Provider   clindamycin (CLEOCIN) 150 MG capsule Take 2 capsules (300 mg total) by mouth 3 (three) times daily for 10 days. 60 capsule Kamyia Thomason A, NP   predniSONE (STERAPRED UNI-PAK 21 TAB) 10 MG (21) TBPK tablet 6 tabs for 1 day, then 5 tabs for 1 das, then 4 tabs for  1 day, then 3 tabs for 1 day, 2 tabs for 1 day, then 1 tab for 1 day 21 tablet Dahlia Byes A, NP     Controlled Substance Prescriptions [place] Controlled Substance Registry consulted? Not Applicable   Janace Aris, NP 02/22/18 2052

## 2018-02-22 NOTE — ED Triage Notes (Signed)
PT reports left sided sore throat, neck pain, and ear pain. Left tonsil visibly swollen.

## 2018-02-22 NOTE — Discharge Instructions (Addendum)
Prednisone pack as prescribed.  Clindamycin 300 mg every 8 hours x 10 days  We gave you a steroid injection and antibiotic injection here in the clinic For more severe or worsening symptoms despite treatment please go to the ER.

## 2018-02-25 LAB — CULTURE, GROUP A STREP (THRC)

## 2018-05-17 ENCOUNTER — Encounter (HOSPITAL_COMMUNITY): Payer: Self-pay | Admitting: Emergency Medicine

## 2018-05-17 ENCOUNTER — Other Ambulatory Visit: Payer: Self-pay

## 2018-05-17 ENCOUNTER — Ambulatory Visit (HOSPITAL_COMMUNITY)
Admission: EM | Admit: 2018-05-17 | Discharge: 2018-05-17 | Disposition: A | Discharge status: Home / Self Care | Payer: Self-pay | Attending: Emergency Medicine | Admitting: Emergency Medicine

## 2018-05-17 DIAGNOSIS — Z202 Contact with and (suspected) exposure to infections with a predominantly sexual mode of transmission: Secondary | ICD-10-CM | POA: Insufficient documentation

## 2018-05-17 DIAGNOSIS — Z113 Encounter for screening for infections with a predominantly sexual mode of transmission: Secondary | ICD-10-CM

## 2018-05-17 MED ORDER — AZITHROMYCIN 250 MG PO TABS
ORAL_TABLET | ORAL | Status: AC
  Filled 2018-05-17: qty 4

## 2018-05-17 MED ORDER — AZITHROMYCIN 250 MG PO TABS
1000.0000 mg | ORAL_TABLET | Freq: Once | ORAL | Status: AC
  Administered 2018-05-17: 18:00:00 1000 mg via ORAL

## 2018-05-17 NOTE — Discharge Instructions (Signed)
Will notify you of any positive findings and if any changes to treatment are needed.   °You may monitor your results on your MyChart online as well.   °Please withhold from intercourse for the next week. °Please use condoms to prevent STD's.   °
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
## 2018-05-17 NOTE — ED Triage Notes (Signed)
Pt presents to [facility] for assessment after his girlfriend, whom he has not had sexual contact with for 2 months, called him today to tell him she was positive for chlamydia.  Pt denies any symptoms.

## 2018-05-20 ENCOUNTER — Telehealth (HOSPITAL_COMMUNITY): Payer: Self-pay | Admitting: Emergency Medicine

## 2018-05-20 LAB — URINE CYTOLOGY ANCILLARY ONLY
Chlamydia: POSITIVE — AB
Neisseria Gonorrhea: NEGATIVE
Trichomonas: NEGATIVE

## 2018-05-20 NOTE — Telephone Encounter (Signed)
Chlamydia is positive.  This was treated at the urgent care visit with po zithromax 1g.  Pt needs education to please refrain from sexual intercourse for 7 days to give the medicine time to work.  Sexual partners need to be notified and tested/treated.  Condoms may reduce risk of reinfection.  Recheck or followup with PCP for further evaluation if symptoms are not improving.  [facility] notified.  Patient contacted and made aware of all results, all questions answered.   

## 2018-05-27 ENCOUNTER — Ambulatory Visit (HOSPITAL_COMMUNITY)
Admission: EM | Admit: 2018-05-27 | Discharge: 2018-05-27 | Disposition: A | Discharge status: Home / Self Care | Payer: Self-pay | Attending: Family Medicine | Admitting: Family Medicine

## 2018-05-27 DIAGNOSIS — Z202 Contact with and (suspected) exposure to infections with a predominantly sexual mode of transmission: Secondary | ICD-10-CM | POA: Insufficient documentation

## 2018-05-27 MED ORDER — CEFTRIAXONE SODIUM 250 MG IJ SOLR
250.0000 mg | Freq: Once | INTRAMUSCULAR | Status: AC
  Administered 2018-05-27: 15:00:00 250 mg via INTRAMUSCULAR

## 2018-05-27 MED ORDER — AZITHROMYCIN 250 MG PO TABS
1000.0000 mg | ORAL_TABLET | Freq: Once | ORAL | Status: AC
  Administered 2018-05-27: 15:00:00 1000 mg via ORAL

## 2018-05-27 MED ORDER — CEFTRIAXONE SODIUM 250 MG IJ SOLR
INTRAMUSCULAR | Status: AC
  Filled 2018-05-27: qty 250

## 2018-05-27 MED ORDER — AZITHROMYCIN 250 MG PO TABS
ORAL_TABLET | ORAL | Status: AC
  Filled 2018-05-27: qty 4
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
## 2018-05-27 NOTE — Discharge Instructions (Signed)
We did lab testing during this visit.  If there are any abnormal findings that require change in medicine or indicate a positive result, you will be notified.  If all of your tests are normal, you will not be called.   ° °
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
## 2018-05-28 LAB — URINE CYTOLOGY ANCILLARY ONLY
Chlamydia: NEGATIVE
Neisseria Gonorrhea: NEGATIVE
Trichomonas: NEGATIVE

## 2018-06-13 ENCOUNTER — Ambulatory Visit (HOSPITAL_COMMUNITY)
Admission: EM | Admit: 2018-06-13 | Discharge: 2018-06-13 | Disposition: A | Discharge status: Home / Self Care | Payer: Self-pay | Attending: Family Medicine | Admitting: Family Medicine

## 2018-06-13 ENCOUNTER — Encounter (HOSPITAL_COMMUNITY): Payer: Self-pay | Admitting: Emergency Medicine

## 2018-06-13 DIAGNOSIS — B09 Unspecified viral infection characterized by skin and mucous membrane lesions: Secondary | ICD-10-CM | POA: Insufficient documentation

## 2018-06-13 MED ORDER — VALACYCLOVIR HCL 1 G PO TABS: 1000.0000 mg | ORAL_TABLET | Freq: Three times a day (TID) | ORAL | 0 refills | Status: DC

## 2018-06-13 NOTE — ED Provider Notes (Signed)
[facility]    CSN: [id number] Arrival date & time: 06/13/18  [id number]     History   Chief Complaint Chief Complaint  Patient presents with  . Rash    HPI Jerry Ibarra is a 23 y.o. male.   HPI  Patient is here for a new rash.  It is on his penis and genitals.  He states that it is "sore".  Does not itch.  It hurts when it rubs on his close. He does have unprotected sexual relations.  He has 1 partner.  He has been with her for 1 to 2 months.  He has not discussed with her this rash.  He has never had a viral rash or herpes before.  History reviewed. No pertinent past medical history.  There are no active problems to display for this patient.   Past Surgical History:  Procedure Laterality Date  . CIRCUMCISION    . KNEE SURGERY    . TYMPANOSTOMY TUBE PLACEMENT         Home Medications    Prior to Admission medications   Medication Sig Start Date End Date Taking? Authorizing Provider  valACYclovir (VALTREX) 1000 MG tablet Take 1 tablet (1,000 mg total) by mouth 3 (three) times daily. 06/13/18   Eustace MooreNelson, Johan Creveling Sue, MD    Family History Family History  Problem Relation Age of Onset  . Diabetes Mother     Social History Social History   Tobacco Use  . Smoking status: Former Smoker    Types: Cigarettes    Last attempt to quit: 09/2016    Years since quitting: 1.7  . Smokeless tobacco: Never Used  . Tobacco comment: per pt stopped Aug 2018  Substance Use Topics  . Alcohol use: No  . Drug use: No     Allergies   Patient has no known allergies.   Review of Systems Review of Systems  Constitutional: Negative for chills and fever.  HENT: Negative for ear pain and sore throat.   Eyes: Negative for pain and visual disturbance.  Respiratory: Negative for cough and shortness of breath.   Cardiovascular: Negative for chest pain and palpitations.  Gastrointestinal: Negative for abdominal pain and vomiting.  Genitourinary: Positive for penile  pain. Negative for dysuria and hematuria.  Musculoskeletal: Negative for arthralgias and back pain.  Skin: Positive for rash. Negative for color change.  Neurological: Negative for seizures and syncope.  All other systems reviewed and are negative.    Physical Exam Triage Vital Signs ED Triage Vitals  Enc Vitals Group     BP 06/13/18 1011 128/71     Pulse Rate 06/13/18 1011 67     Resp 06/13/18 1011 16     Temp 06/13/18 1011 98.2 F (36.8 C)     Temp src --      SpO2 06/13/18 1011 97 %   No data found.  Updated Vital Signs BP 128/71   Pulse 67   Temp 98.2 F (36.8 C)   Resp 16   SpO2 97%       Physical Exam Constitutional:      General: He is not in acute distress.    Appearance: He is well-developed.  HENT:     Head: Normocephalic and atraumatic.  Eyes:     Conjunctiva/sclera: Conjunctivae normal.     Pupils: Pupils are equal, round, and reactive to light.  Neck:     Musculoskeletal: Normal range of motion.  Cardiovascular:     Rate and Rhythm:  Normal rate.  Pulmonary:     Effort: Pulmonary effort is normal. No respiratory distress.  Abdominal:     General: There is no distension.     Palpations: Abdomen is soft.  Genitourinary:    Comments: Entire shaft of penis is covered with vesicles, some ruptured some intact. Musculoskeletal: Normal range of motion.  Skin:    General: Skin is warm and dry.  Neurological:     General: No focal deficit present.     Mental Status: He is alert.  Psychiatric:        Mood and Affect: Mood normal.        Behavior: Behavior normal.     Comments: Understandably upset      UC Treatments / Results  Labs (all labs ordered are listed, but only abnormal results are displayed) Labs Reviewed  HSV CULTURE AND TYPING  URINE CYTOLOGY ANCILLARY ONLY    EKG None  Radiology No results found.  Procedures Procedures (including critical care time)  Medications Ordered in UC Medications - No data to display  Initial  Impression / Assessment and Plan / UC Course  I have reviewed the triage vital signs and the nursing notes.  Pertinent labs & imaging results that were available during my care of the patient were reviewed by me and considered in my medical decision making (see chart for details).     Discussed that clinically this looks like genital herpes.  I will send a culture.  He understands the culture is not 100% accurate.  He agrees to [facility] chlamydia and trichomonas testing.  Declines RPR and HIV testing against my advice. Final Clinical Impressions(s) / UC Diagnoses   Final diagnoses:  Viral rash     Discharge Instructions     I suspect this rash is from herpes I am treating you with valacyclovir 3 times a day for 7 days.  This is an anti-viral medication Avoid any sexual relations until your rash is completely gone I am also sending testing for other sexually transmitted diseases Call for problems   ED Prescriptions    Medication Sig Dispense Auth. Provider   valACYclovir (VALTREX) 1000 MG tablet Take 1 tablet (1,000 mg total) by mouth 3 (three) times daily. 21 tablet Eustace Moore, MD     Controlled Substance Prescriptions [place] Controlled Substance Registry consulted? Not Applicable   Eustace Moore, MD 06/13/18 1105

## 2018-06-13 NOTE — Discharge Instructions (Signed)
I suspect this rash is from herpes I am treating you with valacyclovir 3 times a day for 7 days.  This is an anti-viral medication Avoid any sexual relations until your rash is completely gone I am also sending testing for other sexually transmitted diseases Call for problems

## 2018-06-13 NOTE — ED Triage Notes (Signed)
Pt states two days ago he woke up and noticed a rash on his pelvic area, states he mightr have used the wrong soap.

## 2018-06-14 LAB — URINE CYTOLOGY ANCILLARY ONLY
Chlamydia: NEGATIVE
Neisseria Gonorrhea: NEGATIVE
Trichomonas: NEGATIVE

## 2018-06-15 ENCOUNTER — Encounter (HOSPITAL_COMMUNITY): Payer: Self-pay

## 2018-06-16 LAB — HSV CULTURE AND TYPING

## 2018-06-18 ENCOUNTER — Telehealth (HOSPITAL_COMMUNITY): Payer: Self-pay | Admitting: Emergency Medicine

## 2018-06-18 NOTE — Telephone Encounter (Signed)
Patient contacted and made aware of all results, all questions answered.   

## 2018-08-16 ENCOUNTER — Encounter (HOSPITAL_COMMUNITY): Payer: Self-pay | Admitting: Emergency Medicine

## 2018-08-16 ENCOUNTER — Ambulatory Visit (HOSPITAL_COMMUNITY)
Admission: EM | Admit: 2018-08-16 | Discharge: 2018-08-16 | Disposition: A | Discharge status: Home / Self Care | Payer: Self-pay | Attending: Family Medicine | Admitting: Family Medicine

## 2018-08-16 ENCOUNTER — Other Ambulatory Visit: Payer: Self-pay

## 2018-08-16 DIAGNOSIS — M546 Pain in thoracic spine: Secondary | ICD-10-CM

## 2018-08-16 MED ORDER — CYCLOBENZAPRINE HCL 5 MG PO TABS: 5.0000 mg | ORAL_TABLET | Freq: Two times a day (BID) | ORAL | 0 refills | Status: DC | PRN

## 2018-08-16 MED ORDER — IBUPROFEN 800 MG PO TABS: 800.0000 mg | ORAL_TABLET | Freq: Three times a day (TID) | ORAL | 0 refills | Status: AC

## 2018-08-16 NOTE — ED Triage Notes (Signed)
Reports on Monday noticed his back hurting.  No known injury.  Patient complains of left lower back pain.    Patient will need a work note

## 2018-08-16 NOTE — Discharge Instructions (Signed)
Back pain most likely muscle strain  Use anti-inflammatories for pain/swelling. You may take up to 800 mg Ibuprofen every 8 hours with food. You may supplement Ibuprofen with Tylenol [phone] mg every 8 hours.   You may use flexeril as needed to help with pain. This is a muscle relaxer and causes sedation- please use only at bedtime or when you will be home and not have to drive/work  Gentle stretching, please continue to move around, avoid complete rest  Please follow-up with back pain persisting, worsening, developing numbness or tingling, issues with urination or bowel movements

## 2018-08-16 NOTE — ED Provider Notes (Signed)
[facility]    CSN: [id number] Arrival date & time: 08/16/18  1855      History   Chief Complaint Chief Complaint  Patient presents with  . Back Pain    HPI Jerry Ibarra is a 23 y.o. male no significant past medical history presenting today for evaluation of back pain.  Patient states that he has had mid back pain since Monday.  Denies any specific injury or inciting incident.  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  He notes that today at work his pain persisted so he left to seek evaluation.  Denies weakness in extremities.  Denies numbness or tingling.  Denies changes in urination or bowel movements.  Denies saddle anesthesia.  Has not take any medicines for symptoms.  Denies history of back problems.  Has minimal pain at rest, pain triggered with heavy lifting.  HPI  History reviewed. No pertinent past medical history.  There are no active problems to display for this patient.   Past Surgical History:  Procedure Laterality Date  . CIRCUMCISION    . KNEE SURGERY    . TYMPANOSTOMY TUBE PLACEMENT         Home Medications    Prior to Admission medications   Medication Sig Start Date End Date Taking? Authorizing Provider  cyclobenzaprine (FLEXERIL) 5 MG tablet Take 1-2 tablets (5-10 mg total) by mouth 2 (two) times daily as needed for muscle spasms. 08/16/18   Izael Bessinger C, PA-C  ibuprofen (ADVIL) 800 MG tablet Take 1 tablet (800 mg total) by mouth 3 (three) times daily. 08/16/18   Xeng Kucher, Junius CreamerHallie C, PA-C    Family History Family History  Problem Relation Age of Onset  . Diabetes Mother     Social History Social History   Tobacco Use  . Smoking status: Current Every Day Smoker    Types: Cigarettes    Last attempt to quit: 09/2016    Years since quitting: 1.8  . Smokeless tobacco: Never Used  . Tobacco comment: per pt stopped Aug 2018  Substance Use Topics  .  Alcohol use: Yes  . Drug use: No     Allergies   Patient has no known allergies.   Review of Systems Review of Systems  Constitutional: Negative for fatigue and fever.  Eyes: Negative for redness, itching and visual disturbance.  Respiratory: Negative for shortness of breath.   Cardiovascular: Negative for chest pain and leg swelling.  Gastrointestinal: Negative for nausea and vomiting.  Genitourinary: Negative for decreased urine volume and difficulty urinating.  Musculoskeletal: Positive for back pain and myalgias. Negative for arthralgias.  Skin: Negative for color change, rash and wound.  Neurological: Negative for dizziness, syncope, weakness, light-headedness and headaches.     Physical Exam Triage Vital Signs ED Triage Vitals  Enc Vitals Group     BP 08/16/18 1924 126/67     Pulse Rate 08/16/18 1924 (!) 55     Resp 08/16/18 1924 18     Temp 08/16/18 1924 98.5 F (36.9 C)     Temp Source 08/16/18 1924 Oral     SpO2 08/16/18 1924 100 %     Weight --      Height --      Head Circumference --      Peak Flow --      Pain Score 08/16/18 1922 8     Pain Loc --  Pain Edu? --      Excl. in [place]? --    No data found.  Updated Vital Signs BP 126/67 (BP Location: Left Arm)   Pulse (!) 55   Temp 98.5 F (36.9 C) (Oral)   Resp 18   SpO2 100%   Visual Acuity Right Eye Distance:   Left Eye Distance:   Bilateral Distance:    Right Eye Near:   Left Eye Near:    Bilateral Near:     Physical Exam Vitals signs and nursing note reviewed.  Constitutional:      Appearance: He is well-developed.     Comments: No acute distress  HENT:     Head: Normocephalic and atraumatic.     Nose: Nose normal.  Eyes:     Conjunctiva/sclera: Conjunctivae normal.  Neck:     Musculoskeletal: Neck supple.  Cardiovascular:     Rate and Rhythm: Normal rate.  Pulmonary:     Effort: Pulmonary effort is normal. No respiratory distress.  Abdominal:     General: There is no  distension.  Musculoskeletal: Normal range of motion.     Comments: Nontender to palpation of cervical, thoracic and lumbar spine midline, no significant reproducible tenderness throughout bilateral thoracic and lumbar regions, patient points to left paraspinal thoracic musculature as source of pain.  Full active range of motion of upper extremities, shoulder strength and grip strength 5/5 and equal bilaterally, radial pulse 2+  Hip and knee strength 5/5 and equal bilaterally.  Gait without abnormality  Skin:    General: Skin is warm and dry.  Neurological:     Mental Status: He is alert and oriented to person, place, and time.      UC Treatments / Results  Labs (all labs ordered are listed, but only abnormal results are displayed) Labs Reviewed - No data to display  EKG   Radiology No results found.  Procedures Procedures (including critical care time)  Medications Ordered in UC Medications - No data to display  Initial Impression / Assessment and Plan / UC Course  I have reviewed the triage vital signs and the nursing notes.  Pertinent labs & imaging results that were available during my care of the patient were reviewed by me and considered in my medical decision making (see chart for details).     No acute injury or fall.  Most likely muscle strain of back from repetitive/heavy lifting.  Will treat with anti-inflammatories and muscle relaxers.  Provided work note for the next 2 days.  Discussed activity modification.Discussed strict return precautions. Patient verbalized understanding and is agreeable with plan.  Final Clinical Impressions(s) / UC Diagnoses   Final diagnoses:  Acute bilateral thoracic back pain     Discharge Instructions     Back pain most likely muscle strain  Use anti-inflammatories for pain/swelling. You may take up to 800 mg Ibuprofen every 8 hours with food. You may supplement Ibuprofen with Tylenol [phone] mg every 8 hours.   You may  use flexeril as needed to help with pain. This is a muscle relaxer and causes sedation- please use only at bedtime or when you will be home and not have to drive/work  Gentle stretching, please continue to move around, avoid complete rest  Please follow-up with back pain persisting, worsening, developing numbness or tingling, issues with urination or bowel movements    ED Prescriptions    Medication Sig Dispense Auth. Provider   ibuprofen (ADVIL) 800 MG tablet Take 1 tablet (800 mg  total) by mouth 3 (three) times daily. 21 tablet Torria Fromer C, PA-C   cyclobenzaprine (FLEXERIL) 5 MG tablet Take 1-2 tablets (5-10 mg total) by mouth 2 (two) times daily as needed for muscle spasms. 24 tablet Rosary Filosa, CenturyHallie C, PA-C     Controlled Substance Prescriptions [place] Controlled Substance Registry consulted? Not Applicable   Lew DawesWieters, Nimsi Males C, [place]-C 08/16/18 1949

## 2018-08-26 ENCOUNTER — Encounter (HOSPITAL_COMMUNITY): Payer: Self-pay | Admitting: Family Medicine

## 2018-08-26 ENCOUNTER — Ambulatory Visit (INDEPENDENT_AMBULATORY_CARE_PROVIDER_SITE_OTHER): Payer: Worker's Compensation

## 2018-08-26 ENCOUNTER — Ambulatory Visit (HOSPITAL_COMMUNITY)
Admission: EM | Admit: 2018-08-26 | Discharge: 2018-08-26 | Disposition: A | Discharge status: Home / Self Care | Payer: Worker's Compensation | Attending: Family Medicine | Admitting: Family Medicine

## 2018-08-26 ENCOUNTER — Other Ambulatory Visit: Payer: Self-pay

## 2018-08-26 DIAGNOSIS — S93492A Sprain of other ligament of left ankle, initial encounter: Secondary | ICD-10-CM

## 2018-08-26 NOTE — Discharge Instructions (Addendum)
Your x-rays show no fracture  Ice, rest, elevation, and ibuprofen should be sufficient for treatment and you can return to work on Thursday as scheduled.

## 2018-08-26 NOTE — ED Triage Notes (Signed)
Pt sts left ankle pain after injuring today

## 2018-08-26 NOTE — ED Provider Notes (Signed)
[place]    CSN: [id number] Arrival date & time: 08/26/18  1627     History   Chief Complaint Chief Complaint  Patient presents with  . Ankle Pain    HPI Jerry Ibarra is a 23 y.o. male.   23 year old [facility] established patient presents with a left ankle injury that he suffered at work.  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  Tender lateral malleolus and pain with weight bearing.   He was last seen here 10 days ago with a thoracic muscle strain.     History reviewed. No pertinent past medical history.  There are no active problems to display for this patient.   Past Surgical History:  Procedure Laterality Date  . CIRCUMCISION    . KNEE SURGERY    . TYMPANOSTOMY TUBE PLACEMENT         Home Medications    Prior to Admission medications   Medication Sig Start Date End Date Taking? Authorizing Provider  ibuprofen (ADVIL) 800 MG tablet Take 1 tablet (800 mg total) by mouth 3 (three) times daily. 08/16/18   Wieters, Elesa Hacker, PA-C    Family History Family History  Problem Relation Age of Onset  . Diabetes Mother     Social History Social History   Tobacco Use  . Smoking status: Current Every Day Smoker    Types: Cigarettes    Last attempt to quit: 09/2016    Years since quitting: 1.9  . Smokeless tobacco: Never Used  . Tobacco comment: per pt stopped Aug 2018  Substance Use Topics  . Alcohol use: Yes  . Drug use: No     Allergies   Patient has no known allergies.   Review of Systems Review of Systems  Musculoskeletal: Positive for gait problem.  All other systems reviewed and are negative.    Physical Exam Triage Vital Signs ED Triage Vitals  Enc Vitals Group     BP      Pulse      Resp      Temp      Temp src      SpO2      Weight      Height      Head Circumference      Peak Flow      Pain Score      Pain Loc      Pain Edu?      Excl.  in [place]?    No data found.  Updated Vital Signs BP 131/78 (BP Location: Left Arm)   Pulse 65   Temp 97.8 F (36.6 C) (Temporal)   Resp 16   SpO2 100%    Physical Exam Vitals signs and nursing note reviewed.  Constitutional:      General: He is not in acute distress.    Appearance: Normal appearance. He is normal weight. He is not ill-appearing.  HENT:     Head: Normocephalic.  Eyes:     Conjunctiva/sclera: Conjunctivae normal.  Neck:     Musculoskeletal: Normal range of motion and neck supple.  Cardiovascular:     Rate and Rhythm: Normal rate.  Pulmonary:     Effort: Pulmonary effort is normal.  Musculoskeletal:        General: Tenderness and signs of injury present. No swelling or deformity.     Comments: Tender tip of lateral malleolus  No tenderness of  foot, achilles tendon, or medial malleolus of affect left lower extremity  Skin:    General: Skin is warm and dry.  Neurological:     General: No focal deficit present.     Mental Status: He is alert.  Psychiatric:        Mood and Affect: Mood normal.        Behavior: Behavior normal.      UC Treatments / Results  Labs (all labs ordered are listed, but only abnormal results are displayed) Labs Reviewed - No data to display  EKG   Radiology Dg Ankle Complete Left  Result Date: 08/26/2018 CLINICAL DATA:  Inversion injury with lateral malleolar pain EXAM: LEFT ANKLE COMPLETE - 3+ VIEW COMPARISON:  10/31/2014 FINDINGS: There is no evidence of fracture, dislocation, or joint effusion. There is no evidence of arthropathy or other focal bone abnormality. Soft tissues are unremarkable. IMPRESSION: Negative. Electronically Signed   By: Jasmine PangKim  Fujinaga M.D.   On: 08/26/2018 17:06    Procedures Procedures (including critical care time)  Medications Ordered in UC Medications - No data to display  Initial Impression / Assessment and Plan / UC Course  I have reviewed the triage vital signs and the nursing notes.   Pertinent labs & imaging results that were available during my care of the patient were reviewed by me and considered in my medical decision making (see chart for details).    Final Clinical Impressions(s) / UC Diagnoses   Final diagnoses:  Sprain of anterior talofibular ligament of left ankle, initial encounter     Discharge Instructions     Your x-rays show no fracture  Ice, rest, elevation, and ibuprofen should be sufficient for treatment and you can return to work on Thursday as scheduled.    ED Prescriptions    None     Controlled Substance Prescriptions [place] Controlled Substance Registry consulted? Not Applicable   Elvina SidleLauenstein, Erianna Jolly, MD 08/26/18 [phone]

## 2018-09-19 ENCOUNTER — Ambulatory Visit (HOSPITAL_COMMUNITY)
Admission: EM | Admit: 2018-09-19 | Discharge: 2018-09-19 | Disposition: A | Discharge status: Home / Self Care | Payer: Self-pay | Attending: Urgent Care | Admitting: Urgent Care

## 2018-09-19 ENCOUNTER — Encounter (HOSPITAL_COMMUNITY): Payer: Self-pay

## 2018-09-19 ENCOUNTER — Other Ambulatory Visit: Payer: Self-pay

## 2018-09-19 DIAGNOSIS — Z87828 Personal history of other (healed) physical injury and trauma: Secondary | ICD-10-CM

## 2018-09-19 DIAGNOSIS — Z9889 Other specified postprocedural states: Secondary | ICD-10-CM

## 2018-09-19 DIAGNOSIS — M25561 Pain in right knee: Secondary | ICD-10-CM

## 2018-09-19 MED ORDER — NAPROXEN 500 MG PO TABS: 500.0000 mg | ORAL_TABLET | Freq: Two times a day (BID) | ORAL | 0 refills | Status: AC

## 2018-09-19 NOTE — ED Triage Notes (Signed)
Pt presents with right knee pain after fall injury at work yesterday.  Pt had surgery on that knee a few years ago.

## 2018-09-19 NOTE — ED Provider Notes (Signed)
  MRN: [id number] DOB: [birth year]  Subjective:   Jerry Ibarra is a 23 y.o. male presenting for 1 day history of right knee pain. Reports that he was getting out of his truck yesterday, rolled his left ankle, and heard right knee pop. Woke up this morning with sharp type pain, constant, 6-8/10 worse with bearing weight. Has a hx of torn meniscus in same knee, had surgical repair in 2018. Has not tried any medications for pain.     No current facility-administered medications for this encounter.   Current Outpatient Medications:  .  ibuprofen (ADVIL) 800 MG tablet, Take 1 tablet (800 mg total) by mouth 3 (three) times daily., Disp: 21 tablet, Rfl: 0    No Known Allergies   History reviewed. No pertinent past medical history.    Past Surgical History:  Procedure Laterality Date  . CIRCUMCISION    . KNEE SURGERY    . TYMPANOSTOMY TUBE PLACEMENT      ROS Denies bruising, redness, warmth, bony deformity, knee buckling.  Objective:   Vitals: BP 111/71 (BP Location: Left Arm)   Pulse 62   Temp 98.1 F (36.7 C) (Oral)   Resp 18   SpO2 97%   Physical Exam Constitutional:      Appearance: Normal appearance. He is well-developed and normal weight.  HENT:     Head: Normocephalic and atraumatic.     Right Ear: External ear normal.     Left Ear: External ear normal.     Nose: Nose normal.     Mouth/Throat:     Pharynx: Oropharynx is clear.  Eyes:     Extraocular Movements: Extraocular movements intact.     Pupils: Pupils are equal, round, and reactive to light.  Cardiovascular:     Rate and Rhythm: Normal rate.  Pulmonary:     Effort: Pulmonary effort is normal.  Musculoskeletal:     Right knee: He exhibits decreased range of motion (extension) and swelling (trace-1+). He exhibits no effusion, no ecchymosis, no deformity, no laceration, no erythema, normal alignment, normal patellar mobility, no bony tenderness and normal meniscus. No tenderness found.  Neurological:   Mental Status: He is alert and oriented to person, place, and time.  Psychiatric:        Mood and Affect: Mood normal.        Behavior: Behavior normal.     Assessment and Plan :   1. Acute pain of right knee   2. History of torn meniscus of right knee   3. History of right knee surgery     We will use naproxen for pain and inflammation, Ace wrap and generally recommended rice method.  I do not suspect fracture or dislocation and therefore x-ray would be inappropriate.  Patient needs imaging including ultrasound or MRI which I recommended he obtain through his orthopedic surgeon, [facility]. Counseled patient on potential for adverse effects with medications prescribed/recommended today, ER and return-to-clinic precautions discussed, patient verbalized understanding.    Jaynee Eagles, [place] 09/19/18 1905

## 2021-06-22 IMAGING — DX LEFT ANKLE COMPLETE - 3+ VIEW
3 series · 3 of 3 positions shown · non-contrast
Comparison: 10/31/2014

CLINICAL DATA: Inversion injury with lateral malleolar pain

EXAM:
LEFT ANKLE COMPLETE - 3+ VIEW

[ankle ap]
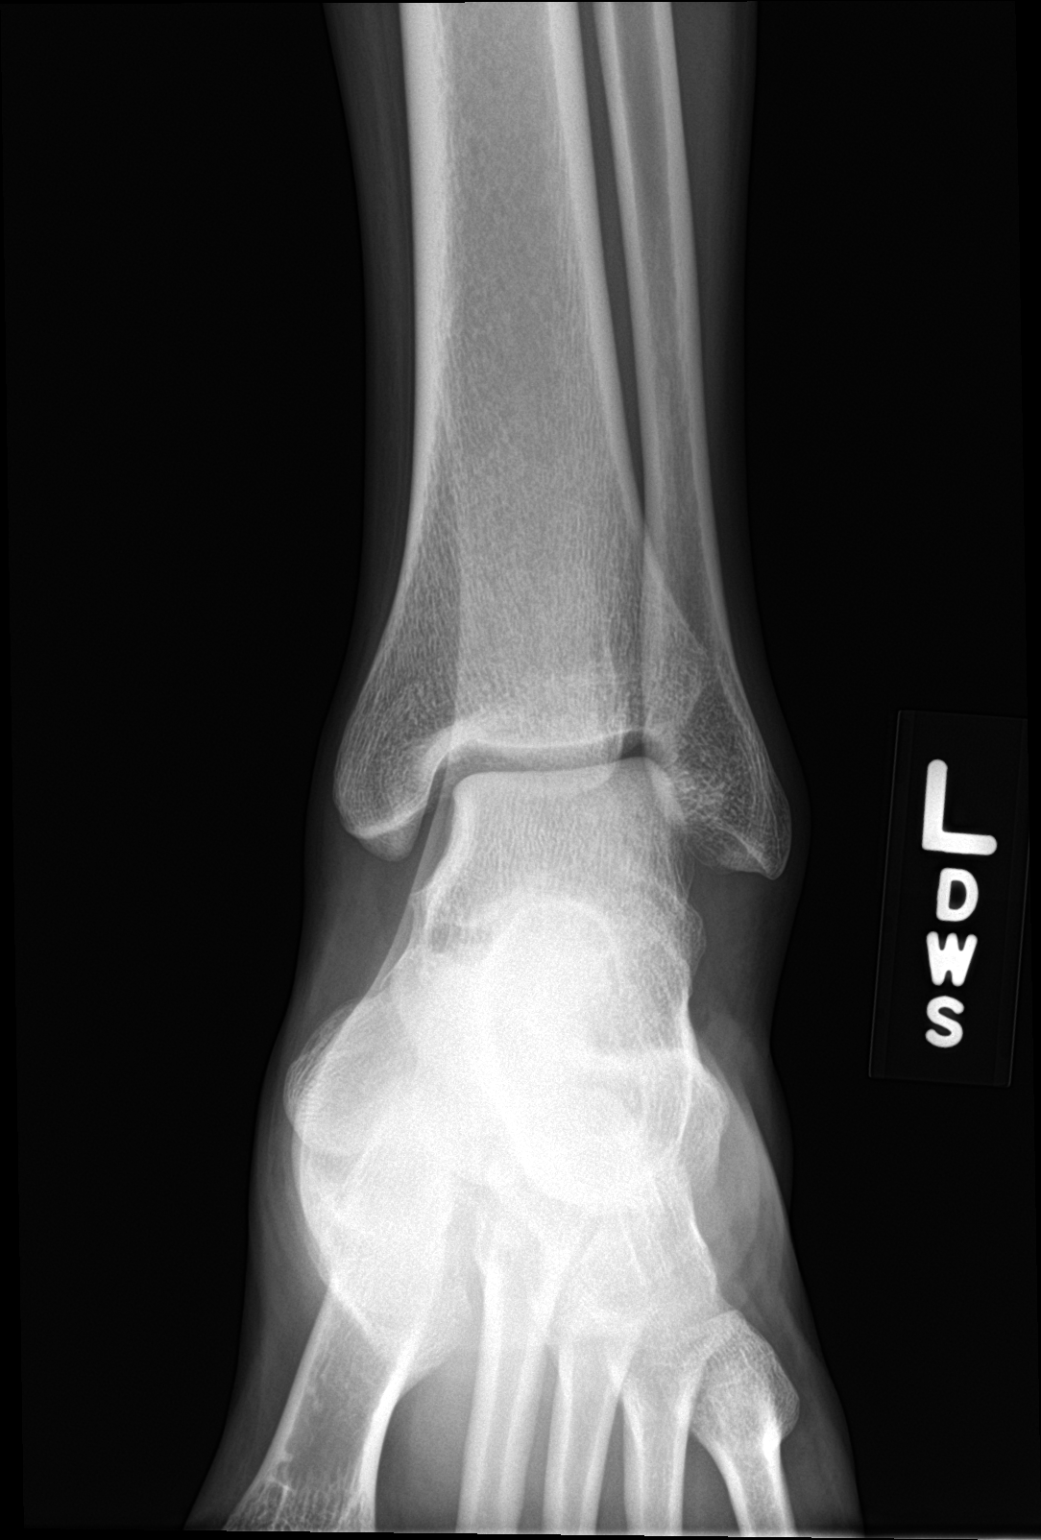

[ankle obl]
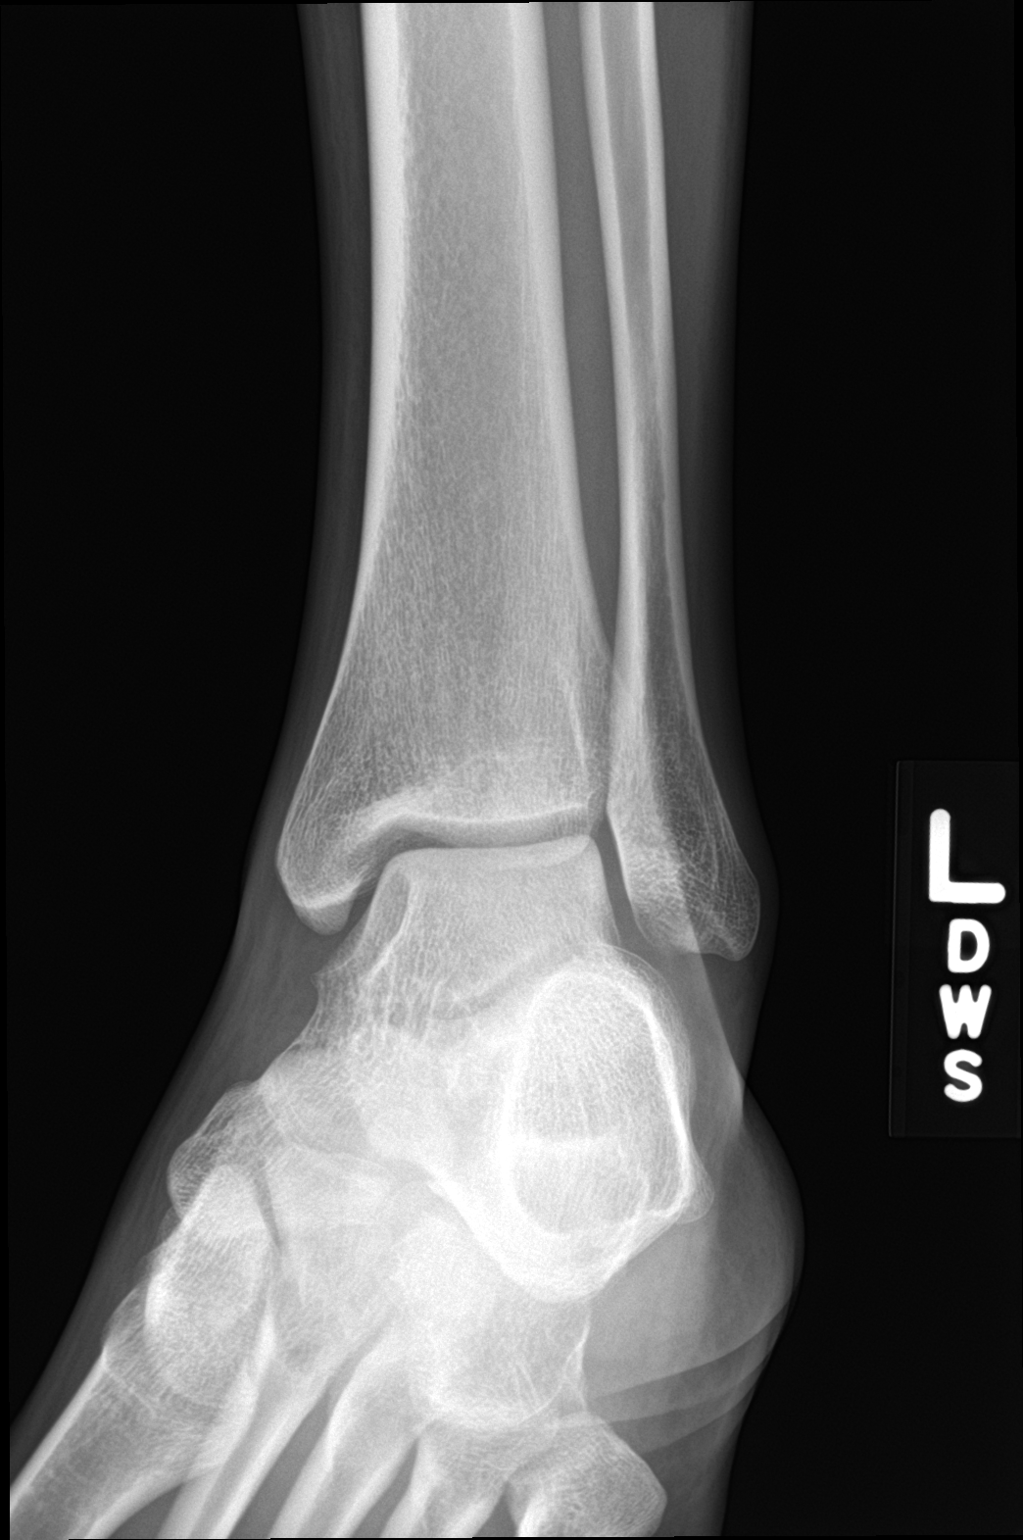

[ankle lat]
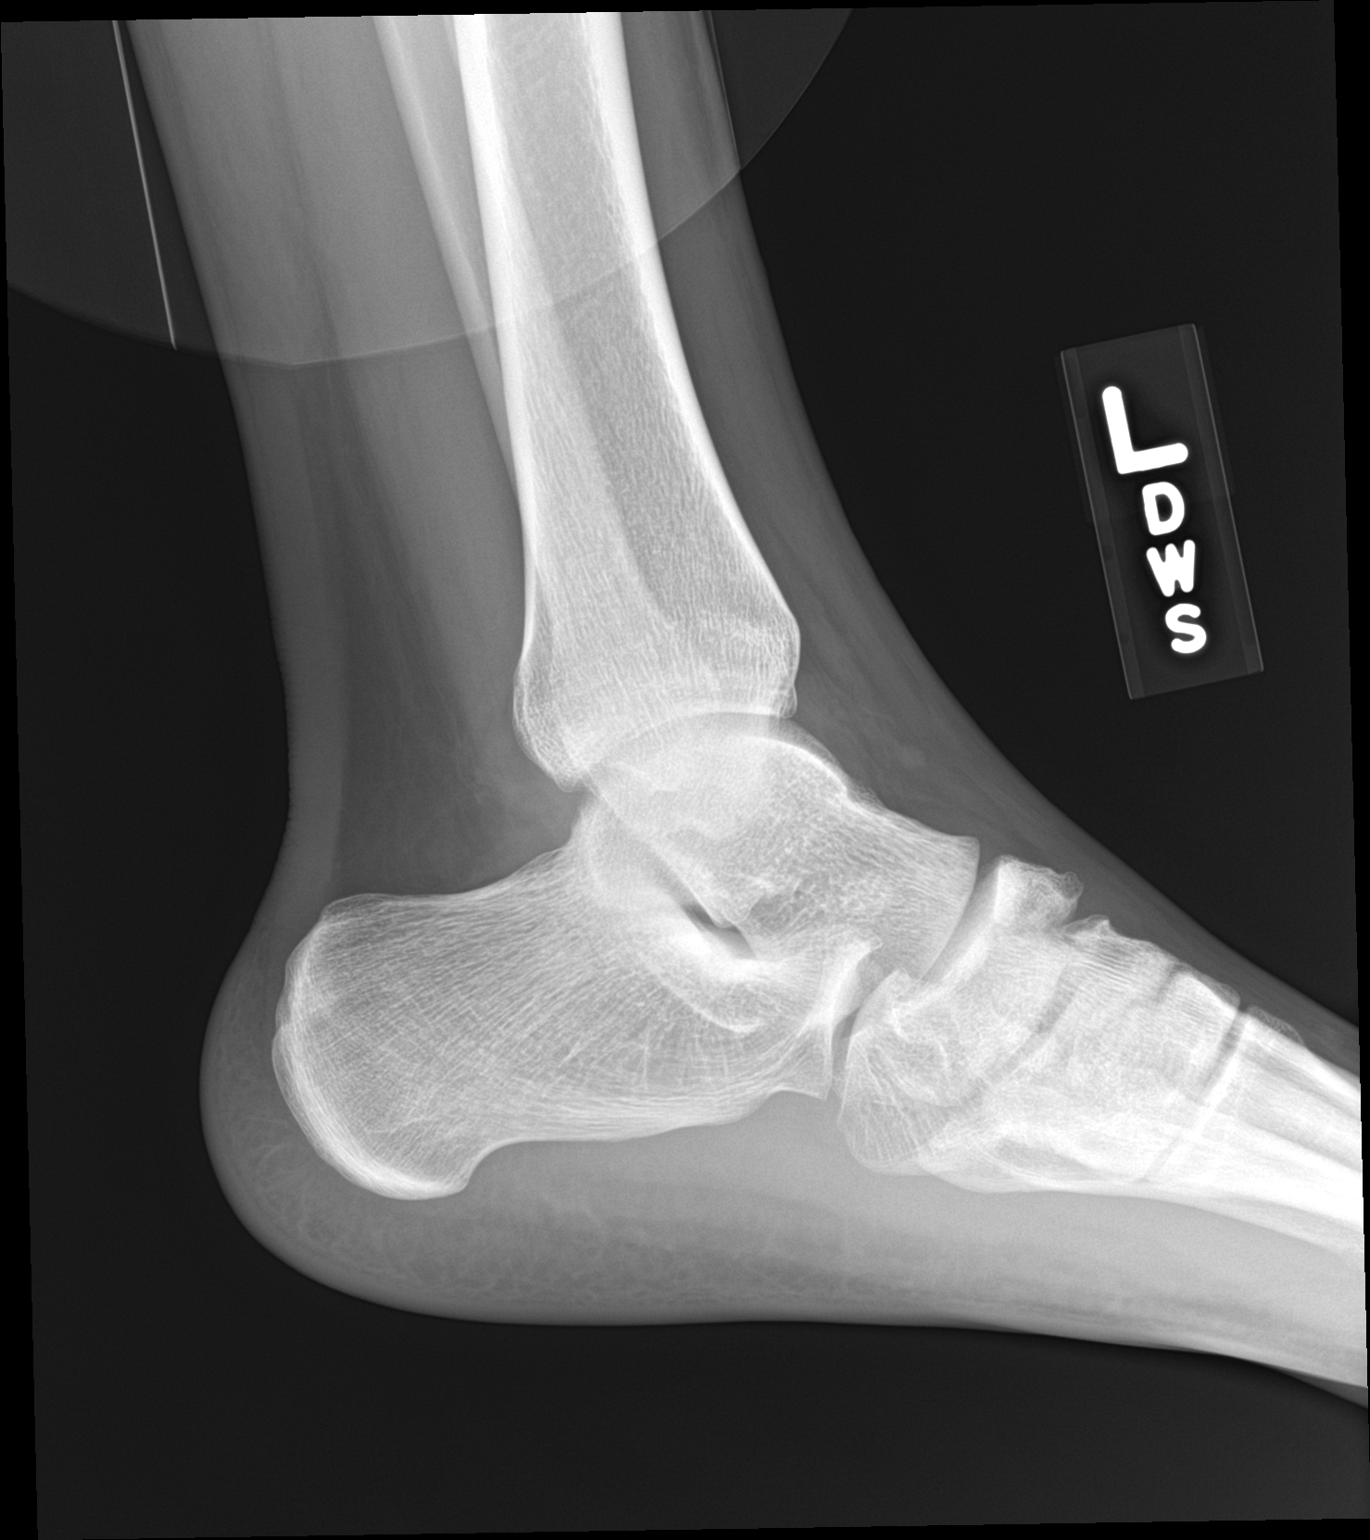

[3 of 3 positions shown; findings below may reference images not displayed]

FINDINGS: There is no evidence of fracture, dislocation, or joint effusion.
There is no evidence of arthropathy or other focal bone abnormality.
Soft tissues are unremarkable.
IMPRESSION: Negative.

## 2021-10-13 ENCOUNTER — Other Ambulatory Visit: Payer: Self-pay

## 2021-10-13 ENCOUNTER — Emergency Department (HOSPITAL_COMMUNITY): Payer: Self-pay

## 2021-10-13 ENCOUNTER — Emergency Department (HOSPITAL_COMMUNITY)
Admission: EM | Admit: 2021-10-13 | Discharge: 2021-10-13 | Disposition: A | Discharge status: Home / Self Care | Payer: Self-pay | Attending: Emergency Medicine | Admitting: Emergency Medicine

## 2021-10-13 DIAGNOSIS — Y9389 Activity, other specified: Secondary | ICD-10-CM | POA: Insufficient documentation

## 2021-10-13 DIAGNOSIS — X501XXA Overexertion from prolonged static or awkward postures, initial encounter: Secondary | ICD-10-CM | POA: Insufficient documentation

## 2021-10-13 DIAGNOSIS — S39012A Strain of muscle, fascia and tendon of lower back, initial encounter: Secondary | ICD-10-CM | POA: Insufficient documentation

## 2021-10-13 MED ORDER — CYCLOBENZAPRINE HCL 10 MG PO TABS
5.0000 mg | ORAL_TABLET | Freq: Once | ORAL | Status: AC
  Administered 2021-10-13: 5 mg via ORAL
  Filled 2021-10-13: qty 1

## 2021-10-13 MED ORDER — IBUPROFEN 400 MG PO TABS
600.0000 mg | ORAL_TABLET | Freq: Once | ORAL | Status: AC
  Administered 2021-10-13: 600 mg via ORAL
  Filled 2021-10-13: qty 1

## 2021-10-13 MED ORDER — CYCLOBENZAPRINE HCL 10 MG PO TABS: 10.0000 mg | ORAL_TABLET | Freq: Two times a day (BID) | ORAL | 0 refills | Status: AC | PRN

## 2021-10-13 MED ORDER — LIDOCAINE 5 % EX PTCH
1.0000 | MEDICATED_PATCH | CUTANEOUS | Status: DC
  Administered 2021-10-13: 1 via TRANSDERMAL
  Filled 2021-10-13: qty 1

## 2021-10-13 NOTE — ED Provider Notes (Signed)
[facility] EMERGENCY DEPARTMENT Provider Note   CSN: [id number] Arrival date & time: 10/13/21  9163     History  Chief Complaint  Patient presents with   Back Pain    Jerry Ibarra is a 26 y.o. male with no documented medical history.  The patient presents to ED for evaluation of low back pain.  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  Patient reports that this pain has been constant ever since this time.  Patient reports that this is worsened with bending over and twisting, relieved by sitting down and lying still.  Patient reports that he did have relief of pain with ibuprofen however pain returned.  Patient denies pain traveling into lower extremities. Patient denies any red flag symptoms low back pain.  The patient denies any IV drug use.  Patient denies any fevers, nausea or vomiting.   Back Pain Associated symptoms: no fever        Home Medications Prior to Admission medications   Medication Sig Start Date End Date Taking? Authorizing Provider  cyclobenzaprine (FLEXERIL) 10 MG tablet Take 1 tablet (10 mg total) by mouth 2 (two) times daily as needed for muscle spasms. 10/13/21  Yes Al Decant, PA-C  ibuprofen (ADVIL) 800 MG tablet Take 1 tablet (800 mg total) by mouth 3 (three) times daily. 08/16/18   Wieters, Hallie C, PA-C  naproxen (NAPROSYN) 500 MG tablet Take 1 tablet (500 mg total) by mouth 2 (two) times daily. 09/19/18   Wallis Bamberg, PA-C      Allergies    Patient has no known allergies.    Review of Systems   Review of Systems  Constitutional:  Negative for fever.  Musculoskeletal:  Positive for back pain.  All other systems reviewed and are negative.   Physical Exam Updated Vital Signs BP 119/78 (BP Location: Right Arm)   Pulse (!) 56   Temp 98.2 F (36.8 C) (Oral)   Resp  17   Ht 5\' 11"  (1.803 m)   Wt 93 kg   SpO2 97%   BMI 28.59 kg/m  Physical Exam Vitals and nursing note reviewed.  Constitutional:      General: He is not in acute distress.    Appearance: Normal appearance. He is not ill-appearing, toxic-appearing or diaphoretic.  HENT:     Head: Normocephalic and atraumatic.     Nose: Nose normal. No congestion.     Mouth/Throat:     Mouth: Mucous membranes are moist.     Pharynx: Oropharynx is clear.  Eyes:     Extraocular Movements: Extraocular movements intact.     Conjunctiva/sclera: Conjunctivae normal.     Pupils: Pupils are equal, round, and reactive to light.  Cardiovascular:     Rate and Rhythm: Normal rate and regular rhythm.  Pulmonary:     Effort: Pulmonary effort is normal.     Breath sounds: Normal breath sounds. No wheezing.  Abdominal:     General: Abdomen is flat. Bowel sounds are normal.     Palpations: Abdomen is soft.     Tenderness: There is no abdominal tenderness.  Musculoskeletal:     Cervical back: Normal range of motion and neck supple. No tenderness.     Lumbar back: Tenderness present.     Comments: Nonfocal and diffuse lumbar spine  tenderness.  There is no centralized spinal tenderness.  Tenderness is paraspinal.  The patient has no overlying skin change.  There is no centralized spinal deformity or crepitus, no step-off.   Skin:    General: Skin is warm and dry.     Capillary Refill: Capillary refill takes less than 2 seconds.  Neurological:     General: No focal deficit present.     Mental Status: He is alert and oriented to person, place, and time.     GCS: GCS eye subscore is 4. GCS verbal subscore is 5. GCS motor subscore is 6.     Cranial Nerves: Cranial nerves 2-12 are intact. No cranial nerve deficit.     Sensory: Sensation is intact. No sensory deficit.     Motor: Motor function is intact. No weakness.     Coordination: Coordination is intact. Heel to [facility] Test normal.     ED Results / Procedures  / Treatments   Labs (all labs ordered are listed, but only abnormal results are displayed) Labs Reviewed - No data to display  EKG None  Radiology DG Lumbar Spine Complete  Result Date: 10/13/2021 CLINICAL DATA:  Back pain EXAM: LUMBAR SPINE - COMPLETE 5 VIEW COMPARISON:  None Available. FINDINGS: There is no evidence of lumbar spine fracture. Alignment is normal. Intervertebral disc spaces are maintained. IMPRESSION: Negative. Electronically Signed   By: Merilyn Baba M.D.   On: 10/13/2021 13:43    Procedures Procedures   Medications Ordered in ED Medications  lidocaine (LIDODERM) 5 % 1 patch (1 patch Transdermal Patch Applied 10/13/21 1313)  cyclobenzaprine (FLEXERIL) tablet 5 mg (5 mg Oral Given 10/13/21 1313)  ibuprofen (ADVIL) tablet 600 mg (600 mg Oral Given 10/13/21 1313)    ED Course/ Medical Decision Making/ A&P                           Medical Decision Making Amount and/or Complexity of Data Reviewed Radiology: ordered.  Risk Prescription drug management.   26 year old male presents to ED for evaluation.  Please see HPI for further details.  On examination patient afebrile and nontachycardic.  Patient lung sounds clear bilaterally.  Not hypoxic on room air.  Patient abdomen soft and compressible.  Patient neurological examination shows no focal neurodeficits.  Patient low back has no overlying skin change, centralized spinal deformity or step-off.  Patient nontoxic in appearance.  Due to low back pain, we will proceed with x-ray imaging of low back.  We will give medications to include ibuprofen, Lidoderm patch, muscle relaxer.  On reassessment, the patient reports that he feels much better.  Patient x-ray imaging is negative for any acute fracture or deformity in the lumbar spine.  The patient is nontoxic in appearance.  Patient is overall well-appearing.  Most likely cause of leg patient low back pain due to lumbar strain.  The patient will be provided with muscle  relaxers, advised to take ibuprofen and Tylenol for pain relief.  Patient will be written out of work for 2 days.  Patient advised to follow-up with PCP for further management.  Patient given return precautions to include red flag symptoms of low back pain.  The patient voices understanding of my instructions.  The patient had all of his questions answered to his satisfaction prior to discharge.  The patient stable at this time for discharge home.  Final Clinical Impression(s) / ED Diagnoses Final diagnoses:  Strain of lumbar region, initial encounter  Rx / DC Orders ED Discharge Orders          Ordered    cyclobenzaprine (FLEXERIL) 10 MG tablet  2 times daily PRN        10/13/21 1356              Al Decant, [place] 10/13/21 1357    Vanetta Mulders, MD 10/14/21 [phone]

## 2021-10-13 NOTE — Discharge Instructions (Addendum)
Please return to the ED with any new symptoms such as inability to control your bowel or bladder, lower extremity weakness, groin numbness Please follow-up with your PCP for further management Please read attached guides concerning lumbar strain and back injuries Please take prescription medication sent into [facility] on [address] Please continue to take ibuprofen/Tylenol for pain relief

## 2021-10-13 NOTE — ED Triage Notes (Signed)
Pt. 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

## 2021-10-13 NOTE — ED Notes (Signed)
Patient alert and oriented, complains of some lower back pain, able to sit in a chair comfortable and ambulate for x-rays, no s/s of distress, will continue to monitor.
